# Patient Record
Sex: Female | Born: 1975 | Race: White | Hispanic: No | Smoking: Never smoker
Health system: Southern US, Community
[De-identification: ages and names within clinical notes are randomized; demographics above are authoritative.]

## PROBLEM LIST (undated history)

## (undated) DIAGNOSIS — F32A Depression, unspecified: Secondary | ICD-10-CM

## (undated) DIAGNOSIS — K219 Gastro-esophageal reflux disease without esophagitis: Secondary | ICD-10-CM

## (undated) DIAGNOSIS — F419 Anxiety disorder, unspecified: Secondary | ICD-10-CM

## (undated) DIAGNOSIS — F329 Major depressive disorder, single episode, unspecified: Secondary | ICD-10-CM

## (undated) DIAGNOSIS — T7840XA Allergy, unspecified, initial encounter: Secondary | ICD-10-CM

## (undated) HISTORY — DX: Gastro-esophageal reflux disease without esophagitis: K21.9

## (undated) HISTORY — DX: Depression, unspecified: F32.A

## (undated) HISTORY — DX: Allergy, unspecified, initial encounter: T78.40XA

## (undated) HISTORY — DX: Major depressive disorder, single episode, unspecified: F32.9

## (undated) HISTORY — DX: Anxiety disorder, unspecified: F41.9

---

## 1995-01-29 DIAGNOSIS — T7840XA Allergy, unspecified, initial encounter: Secondary | ICD-10-CM

## 1995-01-29 HISTORY — DX: Allergy, unspecified, initial encounter: T78.40XA

## 2012-12-08 DIAGNOSIS — D239 Other benign neoplasm of skin, unspecified: Secondary | ICD-10-CM

## 2012-12-08 HISTORY — DX: Other benign neoplasm of skin, unspecified: D23.9

## 2014-05-30 ENCOUNTER — Other Ambulatory Visit: Payer: Self-pay | Admitting: Family Medicine

## 2014-05-30 DIAGNOSIS — Z1239 Encounter for other screening for malignant neoplasm of breast: Secondary | ICD-10-CM

## 2014-06-30 ENCOUNTER — Ambulatory Visit: Payer: Self-pay

## 2014-07-05 ENCOUNTER — Ambulatory Visit
Admission: RE | Admit: 2014-07-05 | Discharge: 2014-07-05 | Disposition: A | Discharge status: Home / Self Care | Payer: BLUE CROSS/BLUE SHIELD | Source: Ambulatory Visit | Attending: Family Medicine | Admitting: Family Medicine

## 2014-07-05 DIAGNOSIS — Z1231 Encounter for screening mammogram for malignant neoplasm of breast: Secondary | ICD-10-CM | POA: Diagnosis not present

## 2014-07-05 DIAGNOSIS — Z1239 Encounter for other screening for malignant neoplasm of breast: Secondary | ICD-10-CM

## 2015-03-02 ENCOUNTER — Other Ambulatory Visit: Payer: Self-pay | Admitting: Family Medicine

## 2015-03-02 NOTE — Telephone Encounter (Signed)
Patient requesting refill. 

## 2016-01-30 ENCOUNTER — Other Ambulatory Visit: Payer: Self-pay | Admitting: Family Medicine

## 2016-01-30 NOTE — Telephone Encounter (Signed)
Patient requesting refill of Sertraline to [facility].

## 2017-03-31 ENCOUNTER — Encounter: Payer: Self-pay | Admitting: Family Medicine

## 2017-04-04 ENCOUNTER — Ambulatory Visit
Admission: RE | Admit: 2017-04-04 | Discharge: 2017-04-04 | Disposition: A | Discharge status: Home / Self Care | Payer: Managed Care, Other (non HMO) | Source: Ambulatory Visit | Attending: Family Medicine | Admitting: Family Medicine

## 2017-04-04 ENCOUNTER — Encounter: Payer: Self-pay | Admitting: Family Medicine

## 2017-04-04 ENCOUNTER — Ambulatory Visit (INDEPENDENT_AMBULATORY_CARE_PROVIDER_SITE_OTHER): Payer: Managed Care, Other (non HMO) | Admitting: Family Medicine

## 2017-04-04 VITALS — BP 128/76 | HR 89 | Temp 98.4°F | Resp 18 | Ht 63.0 in | Wt 133.2 lb

## 2017-04-04 DIAGNOSIS — F419 Anxiety disorder, unspecified: Secondary | ICD-10-CM | POA: Diagnosis not present

## 2017-04-04 DIAGNOSIS — K219 Gastro-esophageal reflux disease without esophagitis: Secondary | ICD-10-CM | POA: Diagnosis not present

## 2017-04-04 DIAGNOSIS — R05 Cough: Secondary | ICD-10-CM | POA: Insufficient documentation

## 2017-04-04 DIAGNOSIS — R0989 Other specified symptoms and signs involving the circulatory and respiratory systems: Secondary | ICD-10-CM

## 2017-04-04 DIAGNOSIS — R058 Other specified cough: Secondary | ICD-10-CM

## 2017-04-04 DIAGNOSIS — F339 Major depressive disorder, recurrent, unspecified: Secondary | ICD-10-CM | POA: Insufficient documentation

## 2017-04-04 DIAGNOSIS — J302 Other seasonal allergic rhinitis: Secondary | ICD-10-CM | POA: Diagnosis not present

## 2017-04-04 MED ORDER — AZITHROMYCIN 250 MG PO TABS: ORAL_TABLET | ORAL | 0 refills | Status: DC

## 2017-04-04 MED ORDER — BUPROPION HCL ER (XL) 150 MG PO TB24
150.0000 mg | ORAL_TABLET | Freq: Every day | ORAL | 0 refills | Status: DC
Start: 2017-04-04 — End: 2017-04-21

## 2017-04-04 MED ORDER — FLUTICASONE FUROATE-VILANTEROL 100-25 MCG/INH IN AEPB: 1.0000 | INHALATION_SPRAY | Freq: Every day | RESPIRATORY_TRACT | 0 refills | Status: DC

## 2017-04-04 NOTE — Progress Notes (Signed)
Name: Gwendolyn Hull   MRN: [id number]    DOB: 10/03/1975   Date:04/04/2017       Progress Note  Subjective  Chief Complaint  Chief Complaint  Patient presents with  . Establish Care  . Depression    HPI  Major Depression: she has a long history of depression. However she has been very down over the past 2 weeks. She moved to [place] a couple of years ago with husband and 3 sons. He had a job transfer. The plan was to move back. Her entire family lives here , they left a house behind and their marriage has not been great for a few years. In January they decided as a family to have her move back with younger son and had house remodeled before her husband can get a transfer back. Since she came back in January she has been feeling overwhelmed. She is teaching at [employer], feels like her marriage has been more fragile. Worried about her future and what she really wants to do. She is crying , feeling tired, lack of motivation. She already has an appointment with therapist for next week.   URI she states she had flu like symptoms 2 weeks ago, she had fever, chills, body aches. She is feeling much better, but continues to have a productive cough, no wheezing or SOB.   GERD: diet controlled   Patient Active Problem List   Diagnosis Date Noted  . GERD (gastroesophageal reflux disease) 04/04/2017  . Major depression, recurrent, chronic ([place]) 04/04/2017  . Anxiety 04/04/2017  . Seasonal allergic rhinitis 04/04/2017  . Allergy 01/29/1995    Past Surgical History:  Procedure Laterality Date  . CESAREAN SECTION  01/1998, 10/1999, 04/2002    Family History  Problem Relation Age of Onset  . Breast cancer Mother 69  . Cancer Mother        breast cancer 31 (age 81), tongue cancer 2011  . Diabetes Mother   . Colon cancer Maternal Grandmother   . Depression Brother   . Diabetes Father   . Hearing loss Father     Social History   Socioeconomic History  . Marital status: Married    Spouse  name: Not on file  . Number of children: 3  . Years of education: Not on file  . Highest education level: Bachelor's degree (e.g., BA, AB, BS)  Social Needs  . Financial resource strain: Not on file  . Food insecurity - worry: Not on file  . Food insecurity - inability: Not on file  . Transportation needs - medical: Not on file  . Transportation needs - non-medical: Not on file  Occupational History  . Occupation: Pharmacist, hospital   Tobacco Use  . Smoking status: Never Smoker  . Smokeless tobacco: Never Used  Substance and Sexual Activity  . Alcohol use: Yes    Comment: occasional drinks, no more than 3 in an evening  . Drug use: No  . Sexual activity: Not Currently    Birth control/protection: Condom  Other Topics Concern  . Not on file  Social History Narrative   Married for the past 71 years   Three sons, youngest is in [place]   They moved to [place] for husband's job about 2 years ago, but she came back to [place] with youngest son to work on their house but is feeling lost and not sure about her desicion     Current Outpatient Medications:  .  sertraline (ZOLOFT) 100 MG tablet, TAKE 1 TABLET BY  MOUTH ONCE DAILY AND TAKE 1 1/2 TABLETS BY MOUTH DAILY THE WEEK BEFORE YOUR CYCLE, Disp: 110 tablet, Rfl: 2  No Known Allergies   ROS  Constitutional: Negative for fever or weight change.  Respiratory: positive  for cough but  shortness of breath.   Cardiovascular: Negative for chest pain or palpitations.  Gastrointestinal: Negative for abdominal pain, no bowel changes.  Musculoskeletal: Negative for gait problem or joint swelling.  Skin: Negative for rash.  Neurological: Negative for dizziness or headache.  No other specific complaints in a complete review of systems (except as listed in HPI above).  Objective  Vitals:   04/04/17 1208  BP: 128/76  Pulse: 89  Resp: 18  Temp: 98.4 F (36.9 C)  TempSrc: Oral  SpO2: 98%  Weight: 133 lb 3.2 oz (60.4 kg)  Height: 5\' 3"  (1.6 m)     Body mass index is 23.6 kg/m.  Physical Exam  Constitutional: Patient appears well-developed and well-nourished.  No distress.  HEENT: head atraumatic, normocephalic, pupils equal and reactive to light,  neck supple, throat within normal limits Cardiovascular: Normal rate, regular rhythm and normal heart sounds.  No murmur heard. No BLE edema. Pulmonary/Chest: Effort normal she has end inspiratory wheezing and some course sounds left lower lung field. No respiratory distress. Abdominal: Soft.  There is no tenderness. Psychiatric: Patient has a depressed mood, . behavior is normal. Judgment and thought content normal.  PHQ2/9: Depression screen PHQ 2/9 04/04/2017  Decreased Interest 1  Down, Depressed, Hopeless 1  PHQ - 2 Score 2  Altered sleeping 1  Tired, decreased energy 3  Change in appetite 2  Feeling bad or failure about yourself  3  Trouble concentrating 1  Moving slowly or fidgety/restless 0  Suicidal thoughts 0  PHQ-9 Score 12  Difficult doing work/chores Somewhat difficult    Fall Risk: Fall Risk  04/04/2017 04/04/2017  Falls in the past year? No No     Functional Status Survey: Is the patient deaf or have difficulty hearing?: No Does the patient have difficulty seeing, even when wearing glasses/contacts?: No Does the patient have difficulty concentrating, remembering, or making decisions?: No Does the patient have difficulty walking or climbing stairs?: No Does the patient have difficulty dressing or bathing?: No Does the patient have difficulty doing errands alone such as visiting a doctor's office or shopping?: No    Assessment & Plan  1. Major depression, recurrent, chronic (HCC)  Continue Zoloft  Add Wellbutrin Continue  - buPROPion (WELLBUTRIN XL) 150 MG 24 hr tablet; Take 1 tablet (150 mg total) by mouth daily.  Dispense: 30 tablet; Refill: 0  2. Gastroesophageal reflux disease without esophagitis  Well controlled at this time  3.  Anxiety   4. Seasonal allergic rhinitis, unspecified trigger  stable  5. Productive cough  - DG Chest 2 View; Future - azithromycin (ZITHROMAX) 250 MG tablet; Take as directed  Dispense: 6 tablet; Refill: 0 - fluticasone furoate-vilanterol (BREO ELLIPTA) 100-25 MCG/INH AEPB; Inhale 1 puff into the lungs daily.  Dispense: 60 each; Refill: 0  6. Abnormal lung sounds  - DG Chest 2 View; Future - azithromycin (ZITHROMAX) 250 MG tablet; Take as directed  Dispense: 6 tablet; Refill: 0 - fluticasone furoate-vilanterol (BREO ELLIPTA) 100-25 MCG/INH AEPB; Inhale 1 puff into the lungs daily.  Dispense: 60 each; Refill: 0

## 2017-04-21 ENCOUNTER — Ambulatory Visit (INDEPENDENT_AMBULATORY_CARE_PROVIDER_SITE_OTHER): Payer: Managed Care, Other (non HMO) | Admitting: Family Medicine

## 2017-04-21 ENCOUNTER — Encounter: Payer: Self-pay | Admitting: Family Medicine

## 2017-04-21 VITALS — BP 120/70 | HR 99 | Resp 14 | Ht 63.0 in | Wt 130.4 lb

## 2017-04-21 DIAGNOSIS — F339 Major depressive disorder, recurrent, unspecified: Secondary | ICD-10-CM | POA: Diagnosis not present

## 2017-04-21 DIAGNOSIS — Z23 Encounter for immunization: Secondary | ICD-10-CM

## 2017-04-21 MED ORDER — BUPROPION HCL ER (XL) 150 MG PO TB24: 150.0000 mg | ORAL_TABLET | Freq: Every day | ORAL | 0 refills | Status: DC

## 2017-04-21 MED ORDER — SERTRALINE HCL 100 MG PO TABS: ORAL_TABLET | ORAL | 1 refills | Status: DC

## 2017-04-21 NOTE — Progress Notes (Signed)
Name: Gwendolyn Hull   MRN: [id number]    DOB: January 11, 1976   Date:04/21/2017       Progress Note  Subjective  Chief Complaint  Chief Complaint  Patient presents with  . Depression    needs a refill on her Zoloft    HPI   Major Depression: she has a long history of depression. She came in early March 2019 with worsening of symptoms. She moved to [place] a couple of years ago with husband and 3 sons. He had a job transfer. The plan was to move back. Her entire family lives here , they left a house behind and their marriage has not been great for a few years. In January they decided as a family to have her move back with younger son and had house remodeled before her husband can get a transfer back. Since she came back in January she has been feeling overwhelmed. 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  Since last visit she is doing much better, she is no on Zoloft 100 mg and we added Wellbutrin 150 mg daily. Also seen by Peggye Ley counselor, and is feeling much better, feels like she is back to her baseline.   Patient Active Problem List   Diagnosis Date Noted  . GERD (gastroesophageal reflux disease) 04/04/2017  . Major depression, recurrent, chronic ([place]) 04/04/2017  . Anxiety 04/04/2017  . Seasonal allergic rhinitis 04/04/2017  . Allergy 01/29/1995    Past Surgical History:  Procedure Laterality Date  . CESAREAN SECTION  01/1998, 10/1999, 04/2002    Family History  Problem Relation Age of Onset  . Breast cancer Mother 74  . Cancer Mother        breast cancer 47 (age 37), tongue cancer 2011  . Diabetes Mother   . Colon cancer Maternal Grandmother   . Depression Brother   . Diabetes Father   . Hearing loss Father     Social History   Socioeconomic History  . Marital status: Married    Spouse name: Not on file  . Number of children: 3  . Years of education: Not on file  . Highest education level: Bachelor's degree (e.g., BA, AB, BS)   Occupational History  . Occupation: Pharmacist, hospital   Social Needs  . Financial resource strain: Not on file  . Food insecurity:    Worry: Not on file    Inability: Not on file  . Transportation needs:    Medical: Not on file    Non-medical: Not on file  Tobacco Use  . Smoking status: Never Smoker  . Smokeless tobacco: Never Used  Substance and Sexual Activity  . Alcohol use: Yes    Comment: occasional drinks, no more than 3 in an evening  . Drug use: No  . Sexual activity: Not Currently    Birth control/protection: Condom  Lifestyle  . Physical activity:    Days per week: 0 days    Minutes per session: 0 min  . Stress: Not at all  Relationships  . Social connections:    Talks on phone: More than three times a week    Gets together: Twice a week    Attends religious service: Never    Active member of club or organization: Yes    Attends meetings of clubs or organizations: More than 4 times per year    Relationship status: Married  . Intimate partner violence:    Fear of current or ex partner: No  Emotionally abused: No    Physically abused: No    Forced sexual activity: No  Other Topics Concern  . Not on file  Social History Narrative   Married for the past 47 years   Three sons, youngest is in [place]   They moved to [place] for husband's job about 2 years ago, but she came back to [place] with youngest son to work on their house but is feeling lost and not sure about her desicion     Current Outpatient Medications:  .  buPROPion (WELLBUTRIN XL) 150 MG 24 hr tablet, Take 1 tablet (150 mg total) by mouth daily., Disp: 30 tablet, Rfl: 0 .  fluticasone furoate-vilanterol (BREO ELLIPTA) 100-25 MCG/INH AEPB, Inhale 1 puff into the lungs daily., Disp: 60 each, Rfl: 0 .  sertraline (ZOLOFT) 100 MG tablet, TAKE 1 TABLET BY MOUTH ONCE DAILY AND TAKE 1 1/2 TABLETS BY MOUTH DAILY THE WEEK BEFORE YOUR CYCLE, Disp: 110 tablet, Rfl: 2 .  azithromycin (ZITHROMAX) 250 MG tablet, Take as  directed (Patient not taking: Reported on 04/21/2017), Disp: 6 tablet, Rfl: 0  No Known Allergies   ROS  Constitutional: Negative for fever or significant weight change.  Respiratory: Negative for cough and shortness of breath.   Cardiovascular: Negative for chest pain or palpitations.  Gastrointestinal: Negative for abdominal pain, no bowel changes.  Musculoskeletal: Negative for gait problem or joint swelling.  Skin: Negative for rash.  Neurological: Negative for dizziness or headache.  No other specific complaints in a complete review of systems (except as listed in HPI above).  Objective  Vitals:   04/21/17 1355  BP: 120/70  Pulse: 99  Resp: 14  SpO2: 98%  Weight: 130 lb 6.4 oz (59.1 kg)  Height: 5\' 3"  (1.6 m)    Body mass index is 23.1 kg/m.  Physical Exam  Constitutional: Patient appears well-developed and well-nourished.  No distress.  HEENT: head atraumatic, normocephalic, pupils equal and reactive to light,  neck supple, throat within normal limits Cardiovascular: Normal rate, regular rhythm and normal heart sounds.  No murmur heard. No BLE edema. Pulmonary/Chest: Effort normal and breath sounds normal. No respiratory distress. Abdominal: Soft.  There is no tenderness. Psychiatric: Patient has a normal mood and affect. behavior is normal. Judgment and thought content normal.  PHQ2/9: Depression screen [facility] 2/9 04/21/2017 04/04/2017  Decreased Interest 0 1  Down, Depressed, Hopeless 0 1  PHQ - 2 Score 0 2  Altered sleeping 0 1  Tired, decreased energy 0 3  Change in appetite 0 2  Feeling bad or failure about yourself  0 3  Trouble concentrating 0 1  Moving slowly or fidgety/restless 0 0  Suicidal thoughts 0 0  PHQ-9 Score 0 12  Difficult doing work/chores Not difficult at all Somewhat difficult     Fall Risk: Fall Risk  04/21/2017 04/04/2017 04/04/2017  Falls in the past year? No No No     Functional Status Survey: Is the patient deaf or have difficulty  hearing?: No Does the patient have difficulty seeing, even when wearing glasses/contacts?: No Does the patient have difficulty concentrating, remembering, or making decisions?: No Does the patient have difficulty walking or climbing stairs?: No Does the patient have difficulty dressing or bathing?: No Does the patient have difficulty doing errands alone such as visiting a doctor's office or shopping?: No    Assessment & Plan  1. Major depression, recurrent, chronic (HCC)  - buPROPion (WELLBUTRIN XL) 150 MG 24 hr tablet; Take 1 tablet (  150 mg total) by mouth daily.  Dispense: 90 tablet; Refill: 0 - sertraline (ZOLOFT) 100 MG tablet; Daily  Dispense: 90 tablet; Refill: 1 Seeing Peggye Ley for therapy.   2. 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

## 2017-06-24 ENCOUNTER — Other Ambulatory Visit: Payer: Self-pay | Admitting: Family Medicine

## 2017-06-24 DIAGNOSIS — F339 Major depressive disorder, recurrent, unspecified: Secondary | ICD-10-CM

## 2017-07-22 ENCOUNTER — Ambulatory Visit: Payer: Managed Care, Other (non HMO) | Admitting: Family Medicine

## 2017-08-29 ENCOUNTER — Encounter: Payer: Self-pay | Admitting: Family Medicine

## 2017-09-03 ENCOUNTER — Ambulatory Visit: Payer: Managed Care, Other (non HMO) | Admitting: Family Medicine

## 2017-09-09 ENCOUNTER — Encounter: Payer: Self-pay | Admitting: Family Medicine

## 2017-09-09 ENCOUNTER — Ambulatory Visit (INDEPENDENT_AMBULATORY_CARE_PROVIDER_SITE_OTHER): Payer: Managed Care, Other (non HMO) | Admitting: Family Medicine

## 2017-09-09 VITALS — BP 122/80 | HR 89 | Temp 98.6°F | Resp 14 | Ht 63.0 in | Wt 133.8 lb

## 2017-09-09 DIAGNOSIS — K219 Gastro-esophageal reflux disease without esophagitis: Secondary | ICD-10-CM

## 2017-09-09 DIAGNOSIS — F339 Major depressive disorder, recurrent, unspecified: Secondary | ICD-10-CM

## 2017-09-09 DIAGNOSIS — F419 Anxiety disorder, unspecified: Secondary | ICD-10-CM

## 2017-09-09 MED ORDER — BUPROPION HCL ER (XL) 150 MG PO TB24: 150.0000 mg | ORAL_TABLET | Freq: Every day | ORAL | 1 refills | Status: DC

## 2017-09-09 MED ORDER — SERTRALINE HCL 100 MG PO TABS: ORAL_TABLET | ORAL | 1 refills | Status: DC

## 2017-09-09 NOTE — Progress Notes (Signed)
Name: Gwendolyn Hull   MRN: [id number]    DOB: [birth year]   Date:09/09/2017       Progress Note  Subjective  Chief Complaint  Chief Complaint  Patient presents with  . Depression    HPI  Major Depression: she has a long history of depression. She came in early March 2019 with worsening of symptoms. She moved to [place] a couple of years ago with husband and 3 sons. He had a job transfer. The plan was to move back. Her entire family moved there by they left a house behind. January 2019 they decided as a family to have her move back with younger son to remodeled the house , since the plan was always to come back.  Since felt overwhelmed as a single parent, away from her husband, strained her marriage .  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  She is still taking zoloft and wellbutrin, no side of medication. Doing well.   GERD: doing well, no symptoms.   Patient Active Problem List   Diagnosis Date Noted  . GERD (gastroesophageal reflux disease) 04/04/2017  . Major depression, recurrent, chronic ([place]) 04/04/2017  . Anxiety 04/04/2017  . Seasonal allergic rhinitis 04/04/2017  . Allergy 01/29/1995    Past Surgical History:  Procedure Laterality Date  . CESAREAN SECTION  01/1998, 10/1999, 04/2002    Family History  Problem Relation Age of Onset  . Breast cancer Mother 50  . Cancer Mother        breast cancer 1 (age 43), tongue cancer 2011  . Diabetes Mother   . Colon cancer Maternal Grandmother   . Depression Brother   . Diabetes Father   . Hearing loss Father     Social History   Socioeconomic History  . Marital status: Married    Spouse name: Rodman Key  . Number of children: 3  . Years of education: Not on file  . Highest education level: Bachelor's degree (e.g., BA, AB, BS)  Occupational History  . Occupation: Pharmacist, hospital   Social  Needs  . Financial resource strain: Not hard at all  . Food insecurity:    Worry: Never true    Inability: Never true  . Transportation needs:    Medical: No    Non-medical: No  Tobacco Use  . Smoking status: Never Smoker  . Smokeless tobacco: Never Used  Substance and Sexual Activity  . Alcohol use: Yes    Comment: occasional drinks, no more than 3 in an evening  . Drug use: No  . Sexual activity: Not Currently    Birth control/protection: Condom  Lifestyle  . Physical activity:    Days per week: 0 days    Minutes per session: 0 min  . Stress: To some extent  Relationships  . Social connections:    Talks on phone: More than three times a week    Gets together: Twice a week    Attends religious service: Never    Active member of club or organization: Yes    Attends meetings of clubs or organizations: More than 4 times per year    Relationship status: Married  . Intimate partner violence:    Fear of current or ex partner: No    Emotionally abused: No    Physically abused: No    Forced sexual activity: No  Other  Topics Concern  . Not on file  Social History Narrative   Married for the past 60 years   Three sons, youngest is in [place]   They moved to [place] for husband's job about 2 years ago, but she came back to [place] with youngest son to work on their house but is feeling lost and not sure about her desicion     Current Outpatient Medications:  .  buPROPion (WELLBUTRIN XL) 150 MG 24 hr tablet, TAKE 1 TABLET BY MOUTH EVERY DAY, Disp: 90 tablet, Rfl: 0 .  sertraline (ZOLOFT) 100 MG tablet, Daily, Disp: 90 tablet, Rfl: 1  No Known Allergies   ROS  Constitutional: Negative for fever or weight change.  Respiratory: Negative for cough and shortness of breath.   Cardiovascular: Negative for chest pain or palpitations.  Gastrointestinal: Negative for abdominal pain, no bowel changes.  Musculoskeletal: Negative for gait problem or joint swelling.  Skin: Negative for rash.   Neurological: Negative for dizziness or headache.  No other specific complaints in a complete review of systems (except as listed in HPI above).  Objective  Vitals:   09/09/17 1441  BP: 122/80  Pulse: 89  Resp: 14  Temp: 98.6 F (37 C)  TempSrc: Oral  SpO2: 95%  Weight: 133 lb 12.8 oz (60.7 kg)  Height: 5\' 3"  (1.6 m)    Body mass index is 23.7 kg/m.  Physical Exam  Constitutional: Patient appears well-developed and well-nourished. No distress.  HEENT: head atraumatic, normocephalic, pupils equal and reactive to light,neck supple, throat within normal limits Cardiovascular: Normal rate, regular rhythm and normal heart sounds.  No murmur heard. No BLE edema. Pulmonary/Chest: Effort normal and breath sounds normal. No respiratory distress. Abdominal: Soft.  There is no tenderness. Psychiatric: Patient has a normal mood and affect. behavior is normal. Judgment and thought content normal.  PHQ2/9: Depression screen [facility] 2/9 09/09/2017 04/21/2017 04/04/2017  Decreased Interest 0 0 1  Down, Depressed, Hopeless 0 0 1  PHQ - 2 Score 0 0 2  Altered sleeping 0 0 1  Tired, decreased energy 1 0 3  Change in appetite 0 0 2  Feeling bad or failure about yourself  1 0 3  Trouble concentrating 0 0 1  Moving slowly or fidgety/restless 0 0 0  Suicidal thoughts 0 0 0  PHQ-9 Score 2 0 12  Difficult doing work/chores - Not difficult at all Somewhat difficult    Fall Risk: Fall Risk  04/21/2017 04/04/2017 04/04/2017  Falls in the past year? No No No    Functional Status Survey: Is the patient deaf or have difficulty hearing?: No Does the patient have difficulty seeing, even when wearing glasses/contacts?: No Does the patient have difficulty concentrating, remembering, or making decisions?: No Does the patient have difficulty walking or climbing stairs?: No Does the patient have difficulty dressing or bathing?: No Does the patient have difficulty doing errands alone such as visiting a  doctor's office or shopping?: No    Assessment & Plan  1. Major depression, recurrent, chronic (HCC)  - buPROPion (WELLBUTRIN XL) 150 MG 24 hr tablet; Take 1 tablet (150 mg total) by mouth daily.  Dispense: 90 tablet; Refill: 1 - sertraline (ZOLOFT) 100 MG tablet; Daily  Dispense: 90 tablet; Refill: 1  2. Gastroesophageal reflux disease without esophagitis  Doing well at this time  3. Anxiety  - buPROPion (WELLBUTRIN XL) 150 MG 24 hr tablet; Take 1 tablet (150 mg total) by mouth daily.  Dispense: 90 tablet; Refill:  1 - sertraline (ZOLOFT) 100 MG tablet; Daily  Dispense: 90 tablet; Refill: 1

## 2017-10-07 ENCOUNTER — Other Ambulatory Visit: Payer: Self-pay | Admitting: Family Medicine

## 2017-10-07 ENCOUNTER — Other Ambulatory Visit: Payer: Self-pay

## 2017-10-07 DIAGNOSIS — Z01419 Encounter for gynecological examination (general) (routine) without abnormal findings: Secondary | ICD-10-CM

## 2017-10-07 DIAGNOSIS — Z1231 Encounter for screening mammogram for malignant neoplasm of breast: Secondary | ICD-10-CM

## 2017-10-08 LAB — LIPID PANEL
CHOL/HDL RATIO: 3.7 (calc) (ref <5.0)
Cholesterol: 232 mg/dL — ABNORMAL HIGH (ref <200)
HDL: 63 mg/dL (ref >50)
LDL CHOLESTEROL (CALC): 146 mg/dL — AB
Non-HDL Cholesterol (Calc): 169 mg/dL (calc) — ABNORMAL HIGH (ref <130)
Triglycerides: 115 mg/dL (ref <150)

## 2017-10-08 LAB — CBC WITH DIFFERENTIAL/PLATELET
Basophils Absolute: 37 cells/uL (ref 0–200)
Basophils Relative: 0.6 %
EOS ABS: 19 {cells}/uL (ref 15–500)
Eosinophils Relative: 0.3 %
HEMATOCRIT: 39.5 % (ref 35.0–45.0)
HEMOGLOBIN: 13.5 g/dL (ref 11.7–15.5)
LYMPHS ABS: 1668 {cells}/uL (ref 850–3900)
MCH: 31.3 pg (ref 27.0–33.0)
MCHC: 34.2 g/dL (ref 32.0–36.0)
MCV: 91.6 fL (ref 80.0–100.0)
MPV: 10.1 fL (ref 7.5–12.5)
Monocytes Relative: 7.6 %
NEUTROS ABS: 4005 {cells}/uL (ref 1500–7800)
Neutrophils Relative %: 64.6 %
Platelets: 252 10*3/uL (ref 140–400)
RBC: 4.31 10*6/uL (ref 3.80–5.10)
RDW: 11.5 % (ref 11.0–15.0)
Total Lymphocyte: 26.9 %
WBC: 6.2 10*3/uL (ref 3.8–10.8)
WBCMIX: 471 {cells}/uL (ref 200–950)

## 2017-10-08 LAB — COMPREHENSIVE METABOLIC PANEL
AG RATIO: 2 (calc) (ref 1.0–2.5)
ALT: 10 U/L (ref 6–29)
AST: 17 U/L (ref 10–30)
Albumin: 4.2 g/dL (ref 3.6–5.1)
Alkaline phosphatase (APISO): 73 U/L (ref 33–115)
BUN: 11 mg/dL (ref 7–25)
CO2: 26 mmol/L (ref 20–32)
CREATININE: 0.7 mg/dL (ref 0.50–1.10)
Calcium: 8.9 mg/dL (ref 8.6–10.2)
Chloride: 103 mmol/L (ref 98–110)
GLUCOSE: 87 mg/dL (ref 65–99)
Globulin: 2.1 g/dL (calc) (ref 1.9–3.7)
Potassium: 4.2 mmol/L (ref 3.5–5.3)
Sodium: 137 mmol/L (ref 135–146)
TOTAL PROTEIN: 6.3 g/dL (ref 6.1–8.1)
Total Bilirubin: 0.7 mg/dL (ref 0.2–1.2)

## 2017-10-08 LAB — HEMOGLOBIN A1C
HEMOGLOBIN A1C: 5.5 %{Hb} (ref <5.7)
Mean Plasma Glucose: 111 (calc)
eAG (mmol/L): 6.2 (calc)

## 2017-10-08 LAB — TSH: TSH: 1.34 mIU/L

## 2017-10-09 ENCOUNTER — Encounter: Payer: Self-pay | Admitting: Family Medicine

## 2017-10-09 ENCOUNTER — Other Ambulatory Visit (HOSPITAL_COMMUNITY)
Admission: RE | Admit: 2017-10-09 | Discharge: 2017-10-09 | Disposition: A | Discharge status: Home / Self Care | Payer: Managed Care, Other (non HMO) | Source: Ambulatory Visit | Attending: Family Medicine | Admitting: Family Medicine

## 2017-10-09 ENCOUNTER — Ambulatory Visit (INDEPENDENT_AMBULATORY_CARE_PROVIDER_SITE_OTHER): Payer: Managed Care, Other (non HMO) | Admitting: Family Medicine

## 2017-10-09 VITALS — BP 108/74 | Temp 98.3°F | Resp 16 | Ht 62.5 in | Wt 133.9 lb

## 2017-10-09 DIAGNOSIS — Z23 Encounter for immunization: Secondary | ICD-10-CM | POA: Diagnosis not present

## 2017-10-09 DIAGNOSIS — Z01419 Encounter for gynecological examination (general) (routine) without abnormal findings: Secondary | ICD-10-CM | POA: Diagnosis not present

## 2017-10-09 DIAGNOSIS — Z124 Encounter for screening for malignant neoplasm of cervix: Secondary | ICD-10-CM | POA: Insufficient documentation

## 2017-10-09 NOTE — Progress Notes (Signed)
Name: Gwendolyn Hull   MRN: [id number]    DOB: Dec 22, 1975   Date:10/09/2017       Progress Note  Subjective  Chief Complaint  Chief Complaint  Patient presents with  . Annual Exam    HPI   Patient presents for annual CPE   USPSTF grade A and B recommendations    Office Visit from 09/09/2017 in [facility]  AUDIT-C Score  2     Depression:  Depression screen [facility] 2/9 10/09/2017 09/09/2017 04/21/2017 04/04/2017  Decreased Interest 0 0 0 1  Down, Depressed, Hopeless 0 0 0 1  PHQ - 2 Score 0 0 0 2  Altered sleeping 0 0 0 1  Tired, decreased energy 0 1 0 3  Change in appetite 0 0 0 2  Feeling bad or failure about yourself  0 1 0 3  Trouble concentrating 1 0 0 1  Moving slowly or fidgety/restless 0 0 0 0  Suicidal thoughts - 0 0 0  PHQ-9 Score 1 2 0 12  Difficult doing work/chores Not difficult at all - Not difficult at all Somewhat difficult   Hypertension: BP Readings from Last 3 Encounters:  10/09/17 108/74  09/09/17 122/80  04/21/17 120/70   Obesity: Wt Readings from Last 3 Encounters:  10/09/17 133 lb 14.4 oz (60.7 kg)  09/09/17 133 lb 12.8 oz (60.7 kg)  04/21/17 130 lb 6.4 oz (59.1 kg)   BMI Readings from Last 3 Encounters:  10/09/17 24.10 kg/m  09/09/17 23.70 kg/m  04/21/17 23.10 kg/m     STD testing and prevention (HIV/chl/gon/syphilis): N/A Intimate partner violence: negative  Sexual History/Pain during Intercourse: no pain during intercourse Menstrual History/LMP/Abnormal Bleeding: regular cycles Incontinence Symptoms: occasionally has stress incontinence - mild - discussed kegel   Advanced Care Planning: A voluntary discussion about advance care planning including the explanation and discussion of advance directives.  Discussed health care proxy and Living will, and the patient was able to identify a health care proxy as husband   Patient does have a living will.  Breast cancer: mammogram scheduled  BRCA gene screening: mother had  breast cancer, N/A Cervical cancer screening: today    Lipids:  Lab Results  Component Value Date   CHOL 232 (H) 10/07/2017   Lab Results  Component Value Date   HDL 63 10/07/2017   Lab Results  Component Value Date   LDLCALC 146 (H) 10/07/2017   Lab Results  Component Value Date   TRIG 115 10/07/2017   Lab Results  Component Value Date   CHOLHDL 3.7 10/07/2017   No results found for: LDLDIRECT  Glucose:  Glucose, Bld  Date Value Ref Range Status  10/07/2017 87 65 - 99 mg/dL Final    Comment:    .            Fasting reference interval .     Skin cancer: seeing dermatologist   Patient Active Problem List   Diagnosis Date Noted  . GERD (gastroesophageal reflux disease) 04/04/2017  . Major depression, recurrent, chronic ([place]) 04/04/2017  . Anxiety 04/04/2017  . Seasonal allergic rhinitis 04/04/2017  . Allergy 01/29/1995    Past Surgical History:  Procedure Laterality Date  . CESAREAN SECTION  01/1998, 10/1999, 04/2002    Family History  Problem Relation Age of Onset  . Breast cancer Mother 6  . Cancer Mother        breast cancer 89 (age 49), tongue cancer 2011  . Diabetes Mother   . Colon  cancer Maternal Grandmother   . Depression Brother   . Diabetes Father   . Hearing loss Father   . Heart disease Maternal Grandfather   . Cancer Paternal Grandfather   . Diabetes Paternal Grandfather     Social History   Socioeconomic History  . Marital status: Married    Spouse name: Rodman Key  . Number of children: 3  . Years of education: Not on file  . Highest education level: Bachelor's degree (e.g., BA, AB, BS)  Occupational History  . Occupation: Pharmacist, hospital   Social Needs  . Financial resource strain: Not hard at all  . Food insecurity:    Worry: Never true    Inability: Never true  . Transportation needs:    Medical: No    Non-medical: No  Tobacco Use  . Smoking status: Never Smoker  . Smokeless tobacco: Never Used  Substance and Sexual  Activity  . Alcohol use: Yes    Comment: occasional drinks, no more than 3 in an evening  . Drug use: No  . Sexual activity: Yes    Partners: Male    Birth control/protection: Condom  Lifestyle  . Physical activity:    Days per week: 4 days    Minutes per session: 50 min  . Stress: Not at all  Relationships  . Social connections:    Talks on phone: More than three times a week    Gets together: Twice a week    Attends religious service: Never    Active member of club or organization: Yes    Attends meetings of clubs or organizations: More than 4 times per year    Relationship status: Married  . Intimate partner violence:    Fear of current or ex partner: No    Emotionally abused: No    Physically abused: No    Forced sexual activity: No  Other Topics Concern  . 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  buPROPion (WELLBUTRIN XL) 150 MG 24 hr tablet, Take 1 tablet (150 mg total) by mouth daily., Disp: 90 tablet, Rfl: 1 .  sertraline (ZOLOFT) 100 MG tablet, Daily, Disp: 90 tablet, Rfl: 1  No Known Allergies   ROS  Constitutional: Negative for fever or weight change.  Respiratory: Negative for cough and shortness of breath.   Cardiovascular: Negative for chest pain or palpitations.  Gastrointestinal: Negative for abdominal pain, no bowel changes.  Musculoskeletal: Negative for gait problem or joint swelling.  Skin: Negative for rash.  Neurological: Negative for dizziness or headache.  No other specific complaints in a complete review of systems (except as listed in HPI above).  Objective  Vitals:   10/09/17 1022  BP: 108/74  Resp: 16  Temp: 98.3 F  (36.8 C)  TempSrc: Oral  SpO2: 98%  Weight: 133 lb 14.4 oz (60.7 kg)  Height: 5' 2.5" (1.588 m)    Body mass index is 24.1 kg/m.  Physical Exam  Constitutional: Patient appears well-developed and well-nourished. No distress.  HENT: Head: Normocephalic and atraumatic. Ears: B TMs ok, no erythema or effusion; Nose: Nose  normal. Mouth/Throat: Oropharynx is clear and moist. No oropharyngeal exudate.  Eyes: Conjunctivae and EOM are normal. Pupils are equal, round, and reactive to light. No scleral icterus.  Neck: Normal range of motion. Neck supple. No JVD present. No thyromegaly present.  Cardiovascular: Normal rate, regular rhythm and normal heart sounds.  No murmur heard. No BLE edema. Pulmonary/Chest: Effort normal and breath sounds normal. No respiratory distress. Abdominal: Soft. Bowel sounds are normal, no distension. There is no tenderness. no masses Breast: no lumps or masses, no nipple discharge or rashes FEMALE GENITALIA:  External genitalia normal External urethra normal Vaginal vault normal without discharge or lesions Cervix normal without discharge or lesions Bimanual exam normal without masses RECTAL: not done Musculoskeletal: Normal range of motion, no joint effusions. No gross deformities Neurological: he is alert and oriented to person, place, and time. No cranial nerve deficit. Coordination, balance, strength, speech and gait are normal.  Skin: Skin is warm and dry. No rash noted. No erythema.  Psychiatric: Patient has a normal mood and affect. behavior is normal. Judgment and thought content normal.   Recent Results (from the past 2160 hour(s))  Lipid panel     Status: Abnormal   Collection Time: 10/07/17  9:14 AM  Result Value Ref Range   Cholesterol 232 (H) <200 mg/dL   HDL 63 >50 mg/dL   Triglycerides 115 <150 mg/dL   LDL Cholesterol (Calc) 146 (H) mg/dL (calc)    Comment: Reference range: <100 . Desirable range <100 mg/dL for primary prevention;   <70  mg/dL for patients with CHD or diabetic patients  with > or = 2 CHD risk factors. Marland Kitchen LDL-C is now calculated using the Martin-Hopkins  calculation, which is a validated novel method providing  better accuracy than the Friedewald equation in the  estimation of LDL-C.  Cresenciano Genre et al. Annamaria Helling. 1610;960(45): 2061-2068  (http://education.[facility].com/faq/FAQ164)    Total CHOL/HDL Ratio 3.7 <5.0 (calc)   Non-HDL Cholesterol (Calc) 169 (H) <130 mg/dL (calc)    Comment: For patients with diabetes plus 1 major ASCVD risk  factor, treating to a non-HDL-C goal of <100 mg/dL  (LDL-C of <70 mg/dL) is considered a therapeutic  option.   Comprehensive metabolic panel     Status: None   Collection Time: 10/07/17  9:14 AM  Result Value Ref Range   Glucose, Bld 87 65 - 99 mg/dL    Comment: .            Fasting reference interval .    BUN 11 7 - 25 mg/dL   Creat 0.70 0.50 - 1.10 mg/dL   BUN/Creatinine Ratio NOT APPLICABLE 6 - 22 (calc)   Sodium 137 135 - 146 mmol/L   Potassium 4.2 3.5 - 5.3 mmol/L   Chloride 103 98 - 110 mmol/L   CO2 26 20 - 32 mmol/L   Calcium 8.9 8.6 - 10.2 mg/dL   Total Protein 6.3 6.1 - 8.1 g/dL   Albumin 4.2 3.6 - 5.1 g/dL   Globulin 2.1 1.9 - 3.7 g/dL (calc)   AG Ratio 2.0 1.0 - 2.5 (calc)   Total Bilirubin 0.7 0.2 - 1.2 mg/dL   Alkaline phosphatase (APISO) 73 33 - 115 U/L   AST 17 10 - 30 U/L   ALT 10 6 - 29 U/L  CBC with Differential/Platelet     Status: None   Collection Time: 10/07/17  9:14 AM  Result Value Ref Range   WBC 6.2 3.8 - 10.8 Thousand/uL   RBC 4.31 3.80 -  5.10 Million/uL   Hemoglobin 13.5 11.7 - 15.5 g/dL   HCT 39.5 35.0 - 45.0 %   MCV 91.6 80.0 - 100.0 fL   MCH 31.3 27.0 - 33.0 pg   MCHC 34.2 32.0 - 36.0 g/dL   RDW 11.5 11.0 - 15.0 %   Platelets 252 140 - 400 Thousand/uL   MPV 10.1 7.5 - 12.5 fL   Neutro Abs 4,005 1,500 - 7,800 cells/uL   Lymphs Abs 1,668 850 - 3,900 cells/uL   WBC mixed population 471 200 - 950 cells/uL    Eosinophils Absolute 19 15 - 500 cells/uL   Basophils Absolute 37 0 - 200 cells/uL   Neutrophils Relative % 64.6 %   Total Lymphocyte 26.9 %   Monocytes Relative 7.6 %   Eosinophils Relative 0.3 %   Basophils Relative 0.6 %  TSH     Status: None   Collection Time: 10/07/17  9:14 AM  Result Value Ref Range   TSH 1.34 mIU/L    Comment:           Reference Range .           > or = 20 Years  0.40-4.50 .                Pregnancy Ranges           First trimester    0.26-2.66           Second trimester   0.55-2.73           Third trimester    0.43-2.91   HgB A1c     Status: None   Collection Time: 10/07/17  9:14 AM  Result Value Ref Range   Hgb A1c MFr Bld 5.5 <5.7 % of total Hgb    Comment: For the purpose of screening for the presence of diabetes: . <5.7%       Consistent with the absence of diabetes 5.7-6.4%    Consistent with increased risk for diabetes             (prediabetes) > or =6.5%  Consistent with diabetes . This assay result is consistent with a decreased risk of diabetes. . Currently, no consensus exists regarding use of hemoglobin A1c for diagnosis of diabetes in children. . According to American Diabetes Association (ADA) guidelines, hemoglobin A1c <7.0% represents optimal control in non-pregnant diabetic patients. Different metrics may apply to specific patient populations.  Standards of Medical Care in Diabetes(ADA). .    Mean Plasma Glucose 111 (calc)   eAG (mmol/L) 6.2 (calc)     PHQ2/9: Depression screen [facility] 2/9 10/09/2017 09/09/2017 04/21/2017 04/04/2017  Decreased Interest 0 0 0 1  Down, Depressed, Hopeless 0 0 0 1  PHQ - 2 Score 0 0 0 2  Altered sleeping 0 0 0 1  Tired, decreased energy 0 1 0 3  Change in appetite 0 0 0 2  Feeling bad or failure about yourself  0 1 0 3  Trouble concentrating 1 0 0 1  Moving slowly or fidgety/restless 0 0 0 0  Suicidal thoughts - 0 0 0  PHQ-9 Score 1 2 0 12  Difficult doing work/chores Not difficult at all -  Not difficult at all Somewhat difficult    GAD 7 : Generalized Anxiety Score 10/09/2017 09/09/2017 04/21/2017 04/04/2017  Nervous, Anxious, on Edge 0 1 0 1  Control/stop worrying 0 0 0 2  Worry too much - different things 0 1 0 3  Trouble relaxing 0  0 1 0  Restless 0 0 0 0  Easily annoyed or irritable 0 0 0 0  Afraid - awful might happen 0 0 0 2  Total GAD 7 Score 0 '2 1 8  ' Anxiety Difficulty Not difficult at all - Not difficult at all -    Fall Risk: Fall Risk  04/21/2017 04/04/2017 04/04/2017  Falls in the past year? No No No    Functional Status Survey: Is the patient deaf or have difficulty hearing?: No Does the patient have difficulty seeing, even when wearing glasses/contacts?: No Does the patient have difficulty concentrating, remembering, or making decisions?: No Does the patient have difficulty walking or climbing stairs?: No Does the patient have difficulty dressing or bathing?: No Does the patient have difficulty doing errands alone such as visiting a doctor's office or shopping?: No  Assessment & Plan  1. Well woman exam   2. Flu vaccine need  - Flu Vaccine QUAD 6+ mos PF IM (Fluarix Quad PF)  3. Cervical cancer screening  - Cytology - PAP  -USPSTF grade A and B recommendations reviewed with patient; age-appropriate recommendations, preventive care, screening tests, etc discussed and encouraged; healthy living encouraged; see AVS for patient education given to patient -Discussed importance of 150 minutes of physical activity weekly, eat two servings of fish weekly, eat one serving of tree nuts ( cashews, pistachios, pecans, almonds.Marland Kitchen) every other day, eat 6 servings of fruit/vegetables daily and drink plenty of water and avoid sweet beverages.

## 2017-10-09 NOTE — Patient Instructions (Signed)
Preventive Care 40-64 Years, Female Preventive care refers to lifestyle choices and visits with your health care provider that can promote health and wellness. What does preventive care include?  A yearly physical exam. This is also called an annual well check.  Dental exams once or twice a year.  Routine eye exams. Ask your health care provider how often you should have your eyes checked.  Personal lifestyle choices, including: ? Daily care of your teeth and gums. ? Regular physical activity. ? Eating a healthy diet. ? Avoiding tobacco and drug use. ? Limiting alcohol use. ? Practicing safe sex. ? Taking low-dose aspirin daily starting at age 58. ? Taking vitamin and mineral supplements as recommended by your health care provider. What happens during an annual well check? The services and screenings done by your health care provider during your annual well check will depend on your age, overall health, lifestyle risk factors, and family history of disease. Counseling Your health care provider may ask you questions about your:  Alcohol use.  Tobacco use.  Drug use.  Emotional well-being.  Home and relationship well-being.  Sexual activity.  Eating habits.  Work and work Statistician.  Method of birth control.  Menstrual cycle.  Pregnancy history.  Screening You may have the following tests or measurements:  Height, weight, and BMI.  Blood pressure.  Lipid and cholesterol levels. These may be checked every 5 years, or more frequently if you are over 81 years old.  Skin check.  Lung cancer screening. You may have this screening every year starting at age 78 if you have a 30-pack-year history of smoking and currently smoke or have quit within the past 15 years.  Fecal occult blood test (FOBT) of the stool. You may have this test every year starting at age 65.  Flexible sigmoidoscopy or colonoscopy. You may have a sigmoidoscopy every 5 years or a colonoscopy  every 10 years starting at age 30.  Hepatitis C blood test.  Hepatitis B blood test.  Sexually transmitted disease (STD) testing.  Diabetes screening. This is done by checking your blood sugar (glucose) after you have not eaten for a while (fasting). You may have this done every 1-3 years.  Mammogram. This may be done every 1-2 years. Talk to your health care provider about when you should start having regular mammograms. This may depend on whether you have a family history of breast cancer.  BRCA-related cancer screening. This may be done if you have a family history of breast, ovarian, tubal, or peritoneal cancers.  Pelvic exam and Pap test. This may be done every 3 years starting at age 80. Starting at age 36, this may be done every 5 years if you have a Pap test in combination with an HPV test.  Bone density scan. This is done to screen for osteoporosis. You may have this scan if you are at high risk for osteoporosis.  Discuss your test results, treatment options, and if necessary, the need for more tests with your health care provider. Vaccines Your health care provider may recommend certain vaccines, such as:  Influenza vaccine. This is recommended every year.  Tetanus, diphtheria, and acellular pertussis (Tdap, Td) vaccine. You may need a Td booster every 10 years.  Varicella vaccine. You may need this if you have not been vaccinated.  Zoster vaccine. You may need this after age 5.  Measles, mumps, and rubella (MMR) vaccine. You may need at least one dose of MMR if you were born in  1957 or later. You may also need a second dose.  Pneumococcal 13-valent conjugate (PCV13) vaccine. You may need this if you have certain conditions and were not previously vaccinated.  Pneumococcal polysaccharide (PPSV23) vaccine. You may need one or two doses if you smoke cigarettes or if you have certain conditions.  Meningococcal vaccine. You may need this if you have certain  conditions.  Hepatitis A vaccine. You may need this if you have certain conditions or if you travel or work in places where you may be exposed to hepatitis A.  Hepatitis B vaccine. You may need this if you have certain conditions or if you travel or work in places where you may be exposed to hepatitis B.  Haemophilus influenzae type b (Hib) vaccine. You may need this if you have certain conditions.  Talk to your health care provider about which screenings and vaccines you need and how often you need them. This information is not intended to replace advice given to you by your health care provider. Make sure you discuss any questions you have with your health care provider. Document Released: 02/10/2015 Document Revised: 10/04/2015 Document Reviewed: 11/15/2014 Elsevier Interactive Patient Education  2018 Elsevier Inc.  

## 2017-10-13 LAB — CYTOLOGY - PAP
Diagnosis: NEGATIVE
HPV (WINDOPATH): NOT DETECTED

## 2017-10-22 ENCOUNTER — Ambulatory Visit
Admission: RE | Admit: 2017-10-22 | Discharge: 2017-10-22 | Disposition: A | Discharge status: Home / Self Care | Payer: Managed Care, Other (non HMO) | Source: Ambulatory Visit | Attending: Family Medicine | Admitting: Family Medicine

## 2017-10-22 DIAGNOSIS — Z1231 Encounter for screening mammogram for malignant neoplasm of breast: Secondary | ICD-10-CM | POA: Diagnosis present

## 2017-10-23 ENCOUNTER — Other Ambulatory Visit: Payer: Self-pay | Admitting: Family Medicine

## 2017-10-23 DIAGNOSIS — N6310 Unspecified lump in the right breast, unspecified quadrant: Secondary | ICD-10-CM

## 2017-10-23 DIAGNOSIS — N6320 Unspecified lump in the left breast, unspecified quadrant: Secondary | ICD-10-CM

## 2017-10-23 DIAGNOSIS — R928 Other abnormal and inconclusive findings on diagnostic imaging of breast: Secondary | ICD-10-CM

## 2017-11-03 ENCOUNTER — Telehealth: Payer: Self-pay

## 2017-11-03 DIAGNOSIS — Z111 Encounter for screening for respiratory tuberculosis: Secondary | ICD-10-CM

## 2017-11-03 NOTE — Telephone Encounter (Signed)
Copied from [place] [id number]. 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 Please advise. >> Nov 03, 2017 12:08 PM Samson Frederic wrote: Is dr Ancil Boozer willing to draw up lab work for this? Please advise

## 2017-11-03 NOTE — Telephone Encounter (Signed)
Ordered test. Okay to have it done tomorrow

## 2017-11-04 ENCOUNTER — Ambulatory Visit
Admission: RE | Admit: 2017-11-04 | Discharge: 2017-11-04 | Disposition: A | Discharge status: Home / Self Care | Payer: Managed Care, Other (non HMO) | Source: Ambulatory Visit | Attending: Family Medicine | Admitting: Family Medicine

## 2017-11-04 DIAGNOSIS — R928 Other abnormal and inconclusive findings on diagnostic imaging of breast: Secondary | ICD-10-CM

## 2017-11-04 DIAGNOSIS — N6320 Unspecified lump in the left breast, unspecified quadrant: Secondary | ICD-10-CM | POA: Diagnosis present

## 2017-11-04 DIAGNOSIS — N6310 Unspecified lump in the right breast, unspecified quadrant: Secondary | ICD-10-CM

## 2017-11-04 NOTE — Telephone Encounter (Signed)
Spoke with patient and she will stop by sometime this morning

## 2017-11-06 LAB — QUANTIFERON-TB GOLD PLUS
Mitogen-NIL: 9.11 IU/mL
NIL: 0.02 [IU]/mL
QUANTIFERON-TB GOLD PLUS: NEGATIVE
TB1-NIL: 0 IU/mL
TB2-NIL: 0 IU/mL

## 2018-03-12 ENCOUNTER — Ambulatory Visit (INDEPENDENT_AMBULATORY_CARE_PROVIDER_SITE_OTHER): Payer: Managed Care, Other (non HMO) | Admitting: Family Medicine

## 2018-03-12 ENCOUNTER — Encounter: Payer: Self-pay | Admitting: Family Medicine

## 2018-03-12 VITALS — BP 114/68 | HR 98 | Temp 98.3°F | Resp 16 | Ht 63.0 in | Wt 133.3 lb

## 2018-03-12 DIAGNOSIS — K219 Gastro-esophageal reflux disease without esophagitis: Secondary | ICD-10-CM | POA: Diagnosis not present

## 2018-03-12 DIAGNOSIS — F325 Major depressive disorder, single episode, in full remission: Secondary | ICD-10-CM | POA: Diagnosis not present

## 2018-03-12 DIAGNOSIS — F419 Anxiety disorder, unspecified: Secondary | ICD-10-CM | POA: Diagnosis not present

## 2018-03-12 MED ORDER — SERTRALINE HCL 100 MG PO TABS: ORAL_TABLET | ORAL | 1 refills | Status: DC

## 2018-03-12 MED ORDER — BUPROPION HCL ER (XL) 150 MG PO TB24: 150.0000 mg | ORAL_TABLET | Freq: Every day | ORAL | 1 refills | Status: DC

## 2018-03-12 NOTE — Progress Notes (Signed)
Name: Gwendolyn Hull   MRN: [id number]    DOB: 05-14-1975   Date:03/12/2018       Progress Note  Subjective  Chief Complaint  Chief Complaint  Patient presents with  . Medication Refill    6 month followup  . Depression  . Anxiety  . Gastroesophageal Reflux    HPI  Major Depression: she has a long history of depression.She came in early March 2019 with worsening of symptoms. Phq9 was 12, she was on Zoloft and we added Wellbutrin XL , she also saw therapist for period of time, had marital issues also, he moved back Nov 2019, not back to normal but doing okay. Trying to get closer again. Youngest son is doing well in school. 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 No side effects of medication  GERD: doing well, no symptoms. Unchanged  Dyslipidemia:discussed life style modifications  The 10-year ASCVD risk score Mikey Bussing DC Jr., et al., 2013) is: 0.6%   Values used to calculate the score:     Age: 43 years     Sex: Female     Is Non-Hispanic African American: No     Diabetic: No     Tobacco smoker: No     Systolic Blood Pressure: 976 mmHg     Is BP treated: No     HDL Cholesterol: 63 mg/dL     Total Cholesterol: 232 mg/dL  Patient Active Problem List   Diagnosis Date Noted  . GERD (gastroesophageal reflux disease) 04/04/2017  . Major depression, recurrent, chronic ([place]) 04/04/2017  . Anxiety 04/04/2017  . Seasonal allergic rhinitis 04/04/2017  . Allergy 01/29/1995    Past Surgical History:  Procedure Laterality Date  . CESAREAN SECTION  01/1998, 10/1999, 04/2002    Family History  Problem Relation Age of Onset  . Breast cancer Mother 9  . Cancer Mother        breast cancer 42 (age 39), tongue cancer 2011  . Diabetes Mother   . Colon cancer Maternal Grandmother   . Depression Brother   . Diabetes Father   . Hearing loss Father   . Heart disease Maternal Grandfather   . Cancer Paternal Grandfather    . Diabetes Paternal Grandfather     Social History   Socioeconomic History  . Marital status: Married    Spouse name: Rodman Key  . Number of children: 3  . Years of education: Not on file  . Highest education level: Bachelor's degree (e.g., BA, AB, BS)  Occupational History  . Occupation: Pharmacist, hospital   Social Needs  . Financial resource strain: Not hard at all  . Food insecurity:    Worry: Never true    Inability: Never true  . Transportation needs:    Medical: No    Non-medical: No  Tobacco Use  . Smoking status: Never Smoker  . Smokeless tobacco: Never Used  Substance and Sexual Activity  . Alcohol use: Yes    Comment: occasional drinks, no more than 3 in an evening  . Drug use: No  . Sexual activity: Yes    Partners: Male    Birth control/protection: Condom  Lifestyle  . Physical activity:    Days per week: 4 days    Minutes per session: 50 min  . Stress: Not at all  Relationships  . Social connections:    Talks on phone: More than three times a week    Gets together:  Twice a week    Attends religious service: Never    Active member of club or organization: Yes    Attends meetings of clubs or organizations: More than 4 times per year    Relationship status: Married  . Intimate partner violence:    Fear of current or ex partner: No    Emotionally abused: No    Physically abused: No    Forced sexual activity: No  Other Topics Concern  . 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  buPROPion (WELLBUTRIN XL) 150 MG 24 hr tablet, Take 1 tablet (150 mg total) by mouth daily.,  Disp: 90 tablet, Rfl: 1 .  sertraline (ZOLOFT) 100 MG tablet, Daily, Disp: 90 tablet, Rfl: 1  No Known Allergies  I personally reviewed active problem list, medication list, allergies, family history, social history with the patient/caregiver today.   ROS  Constitutional: Negative for fever or weight change.  Respiratory: Negative for cough and shortness of breath.   Cardiovascular: Negative for chest pain or palpitations.  Gastrointestinal: Negative for abdominal pain, no bowel changes.  Musculoskeletal: Negative for gait problem or joint swelling.  Skin: Negative for rash.  Neurological: Negative for dizziness or headache.  No other specific complaints in a complete review of systems (except as listed in HPI above).  Objective  Vitals:   03/12/18 0858  BP: 114/68  Pulse: 98  Resp: 16  Temp: 98.3 F (36.8 C)  TempSrc: Oral  SpO2: 97%  Weight: 133 lb 4.8 oz (60.5 kg)  Height: 5\' 3"  (1.6 m)    Body mass index is 23.61 kg/m.  Physical Exam  Constitutional: Patient appears well-developed and well-nourished.  No distress.  HEENT: head atraumatic, normocephalic, pupils equal and reactive to light, neck supple, throat within normal limits Cardiovascular: Normal rate, regular rhythm and normal heart sounds.  No murmur heard. No BLE edema. Pulmonary/Chest: Effort normal and breath sounds normal. No respiratory distress. Abdominal: Soft.  There is no tenderness. Psychiatric: Patient has a normal mood and affect. behavior is normal. Judgment and thought content normal.  PHQ2/9: Depression screen [facility] 2/9 03/12/2018 10/09/2017 09/09/2017 04/21/2017 04/04/2017  Decreased Interest 0 0 0 0 1  Down, Depressed, Hopeless 0 0 0 0 1  PHQ - 2 Score 0 0 0 0 2  Altered sleeping 0 0 0 0 1  Tired, decreased energy 0 0 1 0 3  Change in appetite 0 0 0 0 2  Feeling bad or failure about yourself  0 0 1 0 3  Trouble concentrating 0 1 0 0 1  Moving slowly or fidgety/restless 0 0 0 0 0  Suicidal  thoughts 0 - 0 0 0  PHQ-9 Score 0 1 2 0 12  Difficult doing work/chores Not difficult at all Not difficult at all - Not difficult at all Somewhat difficult   GAD 7 : Generalized Anxiety Score 03/12/2018 10/09/2017 09/09/2017 04/21/2017  Nervous, Anxious, on Edge 0 0 1 0  Control/stop  worrying 0 0 0 0  Worry too much - different things 0 0 1 0  Trouble relaxing 0 0 0 1  Restless 0 0 0 0  Easily annoyed or irritable 0 0 0 0  Afraid - awful might happen 0 0 0 0  Total GAD 7 Score 0 0 2 1  Anxiety Difficulty Not difficult at all Not difficult at all - Not difficult at all     Fall Risk: Fall Risk  04/21/2017 04/04/2017 04/04/2017  Falls in the past year? No No No      Assessment & Plan  1. Major depression in remission (HCC)  - buPROPion (WELLBUTRIN XL) 150 MG 24 hr tablet; Take 1 tablet (150 mg total) by mouth daily.  Dispense: 90 tablet; Refill: 1 - sertraline (ZOLOFT) 100 MG tablet; Daily  Dispense: 90 tablet; Refill: 1  She will try weaning down on Zoloft to 50 mg since she is feeling well, with instruction to go back to 100 mg if she noticed mood changes  2. Gastroesophageal reflux disease without esophagitis  Doing well  3. Anxiety  - buPROPion (WELLBUTRIN XL) 150 MG 24 hr tablet; Take 1 tablet (150 mg total) by mouth daily.  Dispense: 90 tablet; Refill: 1 - sertraline (ZOLOFT) 100 MG tablet; Daily  Dispense: 90 tablet; Refill: 1

## 2018-05-24 ENCOUNTER — Telehealth: Payer: Managed Care, Other (non HMO) | Admitting: Physician Assistant

## 2018-05-24 DIAGNOSIS — W540XXA Bitten by dog, initial encounter: Secondary | ICD-10-CM

## 2018-05-24 DIAGNOSIS — T148XXA Other injury of unspecified body region, initial encounter: Secondary | ICD-10-CM

## 2018-05-24 NOTE — Progress Notes (Signed)
Based on what you shared with me, I feel your condition warrants further evaluation and I recommend that you be seen for a face to face office visit.     NOTE: If you entered your credit card information for this eVisit, you will not be charged. You may see a "hold" on your card for the $35 but that hold will drop off and you will not have a charge processed.  If you are having a true medical emergency please call 911.  If you need an urgent face to face visit, [place] has four urgent care centers for your convenience.    PLEASE NOTE: THE [facility]STACARE LOCATIONS AND URGENT CARE CLINICS DO NOT HAVE THE TESTING FOR CORONAVIRUS COVID19 AVAILABLE.  IF YOU FEEL YOU NEED THIS TEST YOU MUST GO TO A TRIAGE LOCATION AT [place] ?  [email or web address] to reserve your spot online an avoid wait times  [facility] [address] [place], [place] [address] Modified hours of operation: Monday-Friday, 10 AM to 6 PM  Saturday & Sunday 10 AM to 4 PM *Across the street from [place] (New Address!) [address] [place], [place] [address] *Just off [address], across the road from [place] hours of operation: Monday-Friday, 10 AM to 5 PM  Closed Saturday & Sunday   The following sites will take your insurance:  [facility] a Provider at this Location  [place], [place] [address] 10 am to 8 pm Monday-Friday 12 pm to 8 pm [facility] a Provider at this Location  [place], [place], [place] [address] 8 am to 8 pm Monday-Friday 9 am to 6 pm Saturday 11 am to 6 pm Sunday   [facility]  [phone] Get Driving Directions  [address] [place], [place] [address] 8 am to 8 pm Monday-Friday 8 am to 4 pm Saturday-Sunday   Your e-visit answers were reviewed by a board certified advanced clinical practitioner to complete your personal care plan.  Thank you for using e-Visits.  ===View-only below this line===   ----- Message -----    From: Gwendolyn Hull    Sent: 05/24/2018  5:49 PM EDT      To: E-Visit Mailing List Subject: E-Visit Submission: Insect Sting  E-Visit Submission: Insect Sting --------------------------------  Question: What insect stung you? Answer:   Bee  Question: When did you get stung? Answer:   Today  Question: How many times were you stung? Answer:   Once  Question: Please tell me the location of the sting(s): Answer:   Sorry- this was the best option I could find for my situation. I was bitten by a dog while trimming his nails. The skin is broken and area around the bite marks is bruised.  Question: Did you remove the stinger(s)? Answer:   No  Question: Do you have any redness, itchiness, or swelling around the sting? Answer:   Redness and swelling  Question: Do you have any of the following since being stung? (check all that apply) Answer:   None of the above  Question: "Other" symptoms or comments: Answer:     Question: Do you have a fever? Answer:   No  Question: Is the sting area(s) painful? Answer:   Yes  Question: Please rate your pain in a scale of 1-10 with 0 being the least and 10 being the worst: Answer:   3  Question: Have you tried anything over the counter? (check all that apply) Answer:   Ibuprofen  Question: Have you washed the area(s) with soap and water? Answer:   Yes  Question: Is there any pus draining from the sting(s)? Answer:   No  Question: Please list your medication allergies that you may have ? (If 'none' , please list as 'none') Answer:   None  Question: Please list any additional comments  Answer:     Question: Are you  pregnant? Answer:   I am confident that I am not pregnant  Question: Are you breastfeeding? Answer:   No

## 2018-05-25 ENCOUNTER — Encounter: Payer: Self-pay | Admitting: Family Medicine

## 2018-05-25 ENCOUNTER — Ambulatory Visit (INDEPENDENT_AMBULATORY_CARE_PROVIDER_SITE_OTHER): Payer: Managed Care, Other (non HMO) | Admitting: Family Medicine

## 2018-05-25 ENCOUNTER — Other Ambulatory Visit: Payer: Self-pay

## 2018-05-25 DIAGNOSIS — T148XXA Other injury of unspecified body region, initial encounter: Secondary | ICD-10-CM

## 2018-05-25 DIAGNOSIS — W540XXA Bitten by dog, initial encounter: Secondary | ICD-10-CM

## 2018-05-25 MED ORDER — AMOXICILLIN-POT CLAVULANATE 875-125 MG PO TABS: 1.0000 | ORAL_TABLET | Freq: Two times a day (BID) | ORAL | 0 refills | Status: AC

## 2018-05-25 NOTE — Progress Notes (Signed)
Name: Gwendolyn Hull   MRN: [id number]    DOB: Jun 01, 1975   Date:05/25/2018       Progress Note  Subjective  Chief Complaint  Chief Complaint  Patient presents with  . Animal Bite    on yesterday left forearm, foster dog, up to date on vaccines    I connected with  Arbie Cookey Tavis  on 05/25/18 at 10:00 AM EDT by a video enabled telemedicine application and verified that I am speaking with the correct person using two identifiers.  I discussed the limitations of evaluation and management by telemedicine and the availability of in person appointments. The patient expressed understanding and agreed to proceed. Staff also discussed with the patient that there may be a patient responsible charge related to this service. Patient Location: Home Provider Location: Home Additional Individuals present: None  HPI  Pt presents with concern for dog bite - she is fostering a dog from the shelter, was trying to do his nails, got too close to the quick, and he bit the patient on the LEFT forearm.  There is no redness, swelling, or purulent drainage, area has scabbed.  No fevers chills, no loss of strength or motion in LEFT arm or hand.  [facility] are coming today - she called yesterday immediately after the bite, and they are coming today to remove him from the home.  Dog is UTD on all vaccines including rabies.   Patient Active Problem List   Diagnosis Date Noted  . GERD (gastroesophageal reflux disease) 04/04/2017  . Major depression, recurrent, chronic ([place]) 04/04/2017  . Anxiety 04/04/2017  . Seasonal allergic rhinitis 04/04/2017  . Allergy 01/29/1995    Social History   Tobacco Use  . Smoking status: Never Smoker  . Smokeless tobacco: Never Used  Substance Use Topics  . Alcohol use: Yes    Comment: occasional drinks, no more than 3 in an evening     Current Outpatient Medications:  .  buPROPion (WELLBUTRIN XL) 150 MG 24 hr tablet, Take 1 tablet (150 mg total) by mouth  daily., Disp: 90 tablet, Rfl: 1 .  sertraline (ZOLOFT) 100 MG tablet, Daily, Disp: 90 tablet, Rfl: 1  No Known Allergies  I personally reviewed active problem list, medication list, allergies, notes from last encounter, lab results with the patient/caregiver today.  ROS  Constitutional: Negative for fever or weight change.  Respiratory: Negative for cough and shortness of breath.   Cardiovascular: Negative for chest pain or palpitations.  Gastrointestinal: Negative for abdominal pain, no bowel changes.  Musculoskeletal: Negative for gait problem or joint swelling.  Skin: Negative for rash. See HPI Neurological: Negative for dizziness or headache.  No other specific complaints in a complete review of systems (except as listed in HPI above).   Objective  Virtual encounter, vitals not obtained.  There is no height or weight on file to calculate BMI.  Nursing Note and Vital Signs reviewed.  Physical Exam  Constitutional: Patient appears well-developed and well-nourished. No distress.  HENT: Head: Normocephalic and atraumatic.  Neck: Normal range of motion. Pulmonary/Chest: Effort normal. No respiratory distress. Speaking in complete sentences Neurological: Pt is alert and oriented to person, place, and time. Coordination, speech and gait are normal.  Psychiatric: Patient has a normal mood and affect. behavior is normal. Judgment and thought content normal. Skin: LEFT distal forearm on the radial aspect exhibits small puncture wound with dark red dried sanguinous exudate, no underlying erythema, pt denies tenderness around the site. MSK: Pt denies  weakness in LEFT arm, there is full AROM of the LUE, hand, and fingers.  No results found for this or any previous visit (from the past 72 hour(s)).  Assessment & Plan  1. Dog bite, initial encounter - She has already reported to animal control and the dog is being removed from the home and taken back to the shelter today.  She is  advised of signs and symptoms of rabies and infection and will call us if either should occur.  The animal is UTD on rabies vaccination, and the bite appears superficial, so we will avoid IgG/vaccine for the patient at this time; the area is on the radial aspect and distal (close to joint and bone), so we will treat with prophylactic antibiotic. - amoxicillin-clavulanate (AUGMENTIN) 875-125 MG tablet; Take 1 tablet by mouth 2 (two) times daily for 10 days.  Dispense: 20 tablet; Refill: 0  -Red flags and when to present for emergency care or RTC including fever >101.31F, chest pain, shortness of breath, new/worsening/un-resolving symptoms, reviewed with patient at time of visit. Follow up and care instructions discussed and provided in AVS. - I discussed the assessment and treatment plan with the patient. The patient was provided an opportunity to ask questions and all were answered. The patient agreed with the plan and demonstrated an understanding of the instructions.  I provided 17 minutes of non-face-to-face time during this encounter.  Hubbard Hartshorn, FNP

## 2018-09-02 ENCOUNTER — Encounter: Payer: Self-pay | Admitting: Family Medicine

## 2018-09-10 ENCOUNTER — Encounter: Payer: Self-pay | Admitting: Family Medicine

## 2018-09-10 ENCOUNTER — Ambulatory Visit (INDEPENDENT_AMBULATORY_CARE_PROVIDER_SITE_OTHER): Payer: Self-pay | Admitting: Family Medicine

## 2018-09-10 ENCOUNTER — Other Ambulatory Visit: Payer: Self-pay

## 2018-09-10 DIAGNOSIS — F419 Anxiety disorder, unspecified: Secondary | ICD-10-CM

## 2018-09-10 DIAGNOSIS — F330 Major depressive disorder, recurrent, mild: Secondary | ICD-10-CM

## 2018-09-10 MED ORDER — SERTRALINE HCL 100 MG PO TABS: ORAL_TABLET | ORAL | 1 refills | Status: DC

## 2018-09-10 MED ORDER — BUPROPION HCL ER (XL) 150 MG PO TB24: 150.0000 mg | ORAL_TABLET | Freq: Every day | ORAL | 1 refills | Status: DC

## 2018-09-10 NOTE — Progress Notes (Addendum)
Name: Gwendolyn Hull   MRN: [id number]    DOB: August 05, 1975   Date:09/10/2018       Progress Note  Subjective  Chief Complaint  Chief Complaint  Patient presents with  . Anxiety  . Depression  . Gastroesophageal Reflux    HPI  Major Depression: she has a long history of depression.She came in early March 2019 with worsening of symptoms. Phq9 was 12 and even with all changes is only 2 today , she is on Zoloft and  Wellbutrin XL and seems to be working, she also saw therapist for period of time and needs to go back to discuss changes , had marital issues also, he moved back Nov 2019, she separated from [facility] 08/2018, she told him she was having an affair two years ago, she moved out, told her children about the affair and separation, and middle son is struggling the most.   She was subbing, currently she is working at [employer] now.   GERD: doing well, no symptoms.Unchanged  Dyslipidemia:discussed life style modifications  The 10-year ASCVD risk score Mikey Bussing DC Jr., et al., 2013) is: 0.7%   Values used to calculate the score:     Age: 3 years     Sex: Female     Is Non-Hispanic African American: No     Diabetic: No     Tobacco smoker: No     Systolic Blood Pressure: 740 mmHg     Is BP treated: No     HDL Cholesterol: 63 mg/dL     Total Cholesterol: 232 mg/dL   Patient Active Problem List   Diagnosis Date Noted  . GERD (gastroesophageal reflux disease) 04/04/2017  . Major depression, recurrent, chronic ([place]) 04/04/2017  . Anxiety 04/04/2017  . Seasonal allergic rhinitis 04/04/2017  . Allergy 01/29/1995    Past Surgical History:  Procedure Laterality Date  . CESAREAN SECTION  01/1998, 10/1999, 04/2002    Family History  Problem Relation Age of Onset  . Breast cancer Mother 20  . Cancer Mother        breast cancer 50 (age 37), tongue cancer 2011  . Diabetes Mother   . Colon cancer Maternal Grandmother   . Depression Brother   . Diabetes Father   . Hearing loss Father    . Heart disease Maternal Grandfather   . Cancer Paternal Grandfather   . Diabetes Paternal Grandfather     Social History   Socioeconomic History  . Marital status: Married    Spouse name: Rodman Key  . Number of children: 3  . Years of education: Not on file  . Highest education level: Bachelor's degree (e.g., BA, AB, BS)  Occupational History  . Occupation: Pharmacist, hospital   Social Needs  . Financial resource strain: Not hard at all  . Food insecurity    Worry: Never true    Inability: Never true  . Transportation needs    Medical: No    Non-medical: No  Tobacco Use  . Smoking status: Never Smoker  . Smokeless tobacco: Never Used  Substance and Sexual Activity  . Alcohol use: Yes    Comment: occasional drinks, no more than 3 in an evening  . Drug use: No  . Sexual activity: Yes    Partners: Male    Birth control/protection: Condom  Lifestyle  . Physical activity    Days per week: 4 days    Minutes per session: 50 min  . Stress: Not at all  Relationships  . Social connections  Talks on phone: More than three times a week    Gets together: Twice a week    Attends religious service: Never    Active member of club or organization: Yes    Attends meetings of clubs or organizations: More than 4 times per year    Relationship status: Married  . Intimate partner violence    Fear of current or ex partner: No    Emotionally abused: No    Physically abused: No    Forced sexual activity: No  Other Topics Concern  . 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    Two older sons in college in [place]      Current Outpatient Medications:  .  buPROPion (WELLBUTRIN XL) 150 MG 24 hr tablet,  Take 1 tablet (150 mg total) by mouth daily., Disp: 90 tablet, Rfl: 1 .  sertraline (ZOLOFT) 100 MG tablet, Daily, Disp: 90 tablet, Rfl: 1  No Known Allergies  I personally reviewed active problem list, medication list, allergies, family history, social history with the patient/caregiver today.   ROS  Ten systems reviewed and is negative except as mentioned in HPI   Objective  Vitals:   09/10/18 0909  BP: 120/80  Pulse: 85  Resp: 16  Temp: (!) 97.3 F (36.3 C)  TempSrc: Temporal  SpO2: 97%  Weight: 125 lb 11.2 oz (57 kg)  Height: 5\' 3"  (1.6 m)    Body mass index is 22.27 kg/m.  Physical Exam  Constitutional: Patient appears well-developed and well-nourished.  No distress.  HEENT: head atraumatic, normocephalic, pupils equal and reactive to light,  neck supple, throat within normal limits Cardiovascular: Normal rate, regular rhythm and normal heart sounds.  No murmur heard. No BLE edema. Pulmonary/Chest: Effort normal and breath sounds normal. No respiratory distress. Abdominal: Soft.  There is no tenderness. Psychiatric: Patient has a normal mood and affect. behavior is normal. Judgment and thought content normal. Cried a little during visit   PHQ2/9: Depression screen [facility] 2/9 09/10/2018 09/10/2018 05/25/2018 03/12/2018 10/09/2017  Decreased Interest 0 0 0 0 0  Down, Depressed, Hopeless 0 0 0 0 0  PHQ - 2 Score 0 0 0 0 0  Altered sleeping 1 0 0 0 0  Tired, decreased energy 0 0 0 0 0  Change in appetite 0 0 0 0 0  Feeling bad or failure about yourself  1 0 0 0 0  Trouble concentrating 0 0 0 0 1  Moving slowly or fidgety/restless 0 0 0 0 0  Suicidal thoughts 0 0 0 0 -  PHQ-9 Score 2 0 0 0 1  Difficult doing work/chores Not difficult at all - Not difficult at all Not difficult at all Not difficult at all    phq 9 is positive   Fall Risk: Fall Risk  09/10/2018 05/25/2018 04/21/2017 04/04/2017 04/04/2017  Falls in the past year? 0 0 No No No  Number falls in past yr: 0 0 -  - -  Injury with Fall? 0 0 - - -  Follow up - Falls evaluation completed - - -     Functional Status  Survey: Is the patient deaf or have difficulty hearing?: No Does the patient have difficulty seeing, even when wearing glasses/contacts?: No Does the patient have difficulty concentrating, remembering, or making decisions?: No Does the patient have difficulty walking or climbing stairs?: No Does the patient have difficulty dressing or bathing?: No Does the patient have difficulty doing errands alone such as visiting a doctor's office or shopping?: No    Assessment & Plan   1. Anxiety  - buPROPion (WELLBUTRIN XL) 150 MG 24 hr tablet; Take 1 tablet (150 mg total) by mouth daily.  Dispense: 90 tablet; Refill: 1 - sertraline (ZOLOFT) 100 MG tablet; Daily  Dispense: 90 tablet; Refill: 1  2. Major depression recurrent Mild (HCC)  - buPROPion (WELLBUTRIN XL) 150 MG 24 hr tablet; Take 1 tablet (150 mg total) by mouth daily.  Dispense: 90 tablet; Refill: 1 - sertraline (ZOLOFT) 100 MG tablet; Daily  Dispense: 90 tablet; Refill: 1  Discussed STI screen, she is self pay, advised to check into point of care at [facility] , or health department

## 2018-09-16 ENCOUNTER — Ambulatory Visit: Payer: Self-pay | Admitting: Physician Assistant

## 2018-09-16 ENCOUNTER — Other Ambulatory Visit: Payer: Self-pay

## 2018-09-16 ENCOUNTER — Encounter: Payer: Self-pay | Admitting: Physician Assistant

## 2018-09-16 DIAGNOSIS — Z113 Encounter for screening for infections with a predominantly sexual mode of transmission: Secondary | ICD-10-CM

## 2018-09-16 LAB — WET PREP FOR TRICH, YEAST, CLUE
Trichomonas Exam: NEGATIVE
Yeast Exam: NEGATIVE

## 2018-09-16 NOTE — Progress Notes (Signed)
    STI clinic/screening visit  Subjective:  Gwendolyn Hull is a 43 y.o. female being seen today for an STI screening visit. The patient reports they do not have symptoms.  Patient has the following medical conditions:   Patient Active Problem List   Diagnosis Date Noted  . GERD (gastroesophageal reflux disease) 04/04/2017  . Major depression, recurrent, chronic ([place]) 04/04/2017  . Anxiety 04/04/2017  . Seasonal allergic rhinitis 04/04/2017  . Allergy 01/29/1995     Chief Complaint  Patient presents with  . SEXUALLY TRANSMITTED DISEASE    HPI  Patient reports that she is not having any symptoms and would like a screening.  Has new partner and wants to make sure everything is ok. Using condoms for [facility].   See flowsheet for further details and programmatic requirements.    The following portions of the patient's history were reviewed and updated as appropriate: allergies, current medications, past medical history, past social history, past surgical history and problem list.  Objective:  There were no vitals filed for this visit.  Physical Exam Constitutional:      General: She is not in acute distress.    Appearance: Normal appearance.  HENT:     Head: Normocephalic and atraumatic.     Mouth/Throat:     Mouth: Mucous membranes are moist.     Pharynx: No oropharyngeal exudate or posterior oropharyngeal erythema.  Eyes:     Conjunctiva/sclera: Conjunctivae normal.  Neck:     Musculoskeletal: Neck supple.  Pulmonary:     Effort: Pulmonary effort is normal.  Abdominal:     Palpations: Abdomen is soft. There is no mass.     Tenderness: There is no abdominal tenderness. There is no guarding or rebound.  Genitourinary:    General: Normal vulva.     Rectum: Normal.     Comments: External genitalia/pubic area without nits, lice, edema, erythema, lesions or inguinal adenopathy. Vagina with normal discharge and mucosa. Cervix without visible lesions. Uterus normal size,  firm, mobile, nt, no masses, no CMT, no adnexal tenderness or fullness. Lymphadenopathy:     Cervical: No cervical adenopathy.  Skin:    General: Skin is warm and dry.     Findings: No bruising, erythema, lesion or rash.  Neurological:     Mental Status: She is alert and oriented to person, place, and time.  Psychiatric:        Mood and Affect: Mood normal.        Behavior: Behavior normal.        Thought Content: Thought content normal.        Judgment: Judgment normal.       Assessment and Plan:  Gwendolyn Hull is a 43 y.o. female presenting to the [facility] Department for STI screening  1. Screening for STD (sexually transmitted disease) Patient is without symptoms today. Rec condoms with all sex Await test results.  Counseled patient that RN will call if needs to RTC for any treatment once results are back.  - WET PREP FOR [place], YEAST, [place] [facility] - Syphilis Serology, [facility]     No follow-ups on file.  Future Appointments  Date Time Provider [place]  03/12/2019  8:00 AM Steele Sizer, MD [place], [place]

## 2018-09-21 ENCOUNTER — Other Ambulatory Visit: Payer: Self-pay | Admitting: Family Medicine

## 2018-09-21 DIAGNOSIS — F330 Major depressive disorder, recurrent, mild: Secondary | ICD-10-CM

## 2018-09-21 DIAGNOSIS — F419 Anxiety disorder, unspecified: Secondary | ICD-10-CM

## 2018-10-20 ENCOUNTER — Encounter: Payer: Self-pay | Admitting: Family Medicine

## 2018-10-20 ENCOUNTER — Other Ambulatory Visit: Payer: Self-pay | Admitting: Family Medicine

## 2018-10-20 DIAGNOSIS — F330 Major depressive disorder, recurrent, mild: Secondary | ICD-10-CM

## 2018-10-20 DIAGNOSIS — F419 Anxiety disorder, unspecified: Secondary | ICD-10-CM

## 2018-10-20 MED ORDER — SERTRALINE HCL 100 MG PO TABS: ORAL_TABLET | ORAL | 1 refills | Status: DC

## 2018-10-20 MED ORDER — BUPROPION HCL ER (XL) 150 MG PO TB24: 150.0000 mg | ORAL_TABLET | Freq: Every day | ORAL | 1 refills | Status: DC

## 2018-12-22 ENCOUNTER — Encounter: Payer: Self-pay | Admitting: Family Medicine

## 2019-03-05 ENCOUNTER — Encounter: Payer: Self-pay | Admitting: Family Medicine

## 2019-03-12 ENCOUNTER — Encounter: Payer: Self-pay | Admitting: Family Medicine

## 2019-03-12 ENCOUNTER — Ambulatory Visit (INDEPENDENT_AMBULATORY_CARE_PROVIDER_SITE_OTHER): Payer: 59 | Admitting: Family Medicine

## 2019-03-12 ENCOUNTER — Other Ambulatory Visit: Payer: Self-pay

## 2019-03-12 VITALS — BP 112/70 | HR 86 | Temp 97.3°F | Resp 14 | Ht 63.0 in | Wt 128.0 lb

## 2019-03-12 DIAGNOSIS — F325 Major depressive disorder, single episode, in full remission: Secondary | ICD-10-CM

## 2019-03-12 DIAGNOSIS — J302 Other seasonal allergic rhinitis: Secondary | ICD-10-CM | POA: Diagnosis not present

## 2019-03-12 DIAGNOSIS — F419 Anxiety disorder, unspecified: Secondary | ICD-10-CM | POA: Diagnosis not present

## 2019-03-12 MED ORDER — SERTRALINE HCL 100 MG PO TABS: ORAL_TABLET | ORAL | 1 refills | Status: DC

## 2019-03-12 MED ORDER — BUPROPION HCL ER (XL) 150 MG PO TB24: 150.0000 mg | ORAL_TABLET | Freq: Every day | ORAL | 1 refills | Status: DC

## 2019-03-12 NOTE — Progress Notes (Signed)
Name: Gwendolyn Hull   MRN: [id number]    DOB: 10-25-1975   Date:03/12/2019       Progress Note  Subjective  Chief Complaint  Chief Complaint  Patient presents with  . 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 She states her middle son is not accepting, but youngest son has a good relationship with her, oldest is okay.   AR: it is seasonal, only taking otc medication and is doing well at this time  Patient Active Problem List   Diagnosis Date Noted  . GERD (gastroesophageal reflux disease) 04/04/2017  . Major depression, recurrent, chronic ([place]) 04/04/2017  . Anxiety 04/04/2017  . Seasonal allergic rhinitis 04/04/2017  . Allergy 01/29/1995    Past Surgical History:  Procedure Laterality Date  . CESAREAN SECTION  01/1998, 10/1999, 04/2002    Family History  Problem Relation Age of Onset  . Breast cancer Mother 68  . Cancer Mother        breast cancer 61 (age 33), tongue cancer 2011  . Diabetes Mother   . Colon cancer Maternal Grandmother   . Depression Brother   . Diabetes Father   . Hearing loss Father   . Heart disease Maternal Grandfather   . Cancer Paternal Grandfather   . Diabetes Paternal Grandfather     Social History   Tobacco Use  . Smoking status: Never Smoker  . Smokeless tobacco: Never Used  Substance Use Topics  . Alcohol use: Yes    Comment: occasional drinks, no more than 3 in an evening  . Drug use: No     Current Outpatient Medications:  .  buPROPion (WELLBUTRIN XL) 150 MG 24 hr tablet, Take 1 tablet (150 mg total) by mouth daily.,  Disp: 90 tablet, Rfl: 1 .  sertraline (ZOLOFT) 100 MG tablet, Daily, Disp: 90 tablet, Rfl: 1  No Known Allergies  I personally reviewed active problem list, medication list, allergies, family history, social history, health maintenance with the patient/caregiver today.   ROS  Constitutional: Negative for fever or weight change.  Respiratory: Negative for cough and shortness of breath.   Cardiovascular: Negative for chest pain or palpitations.  Gastrointestinal: Negative for abdominal pain, no bowel changes.  Musculoskeletal: Negative for gait problem or joint swelling.  Skin: Negative for rash.  Neurological: Negative for dizziness or headache.  No other specific complaints in a complete review of systems (except as listed in HPI above).  Objective  Vitals:   03/12/19 0803  BP: 112/70  Pulse: 86  Resp: 14  Temp: (!) 97.3 F (36.3 C)  TempSrc: Temporal  SpO2: 97%  Weight: 128 lb (58.1 kg)  Height: 5\' 3"  (1.6 m)    Body mass index is 22.67 kg/m.  Physical Exam  Constitutional: Patient appears well-developed and well-nourished. No distress.  HEENT: head atraumatic, normocephalic, pupils equal and reactive to light Cardiovascular: Normal rate, regular rhythm and normal heart sounds.  No murmur heard. No BLE edema. Pulmonary/Chest: Effort normal and breath sounds normal. No respiratory distress. Abdominal:  Soft.  There is no tenderness. Psychiatric: Patient has a normal mood and affect. behavior is normal. Judgment and thought content normal.  PHQ2/9: Depression screen [facility] 2/9 03/12/2019 09/10/2018 09/10/2018 05/25/2018 03/12/2018  Decreased Interest 0 0 0 0 0  Down, Depressed, Hopeless 0 0 0 0 0  PHQ - 2 Score 0 0 0 0 0  Altered sleeping 0 1 0 0 0  Tired, decreased energy 0 0 0 0 0  Change in appetite 0 0 0 0 0  Feeling bad or failure about yourself  0 1 0 0 0  Trouble concentrating 0 0 0 0 0  Moving slowly or fidgety/restless 0 0 0 0 0  Suicidal thoughts 0 0 0 0 0   PHQ-9 Score 0 2 0 0 0  Difficult doing work/chores Not difficult at all Not difficult at all - Not difficult at all Not difficult at all  Some recent data might be hidden    phq 9 is negative  Fall Risk: Fall Risk  03/12/2019 09/10/2018 05/25/2018 04/21/2017 04/04/2017  Falls in the past year? 0 0 0 No No  Number falls in past yr: 0 0 0 - -  Injury with Fall? 0 0 0 - -  Follow up Falls evaluation completed - Falls evaluation completed - -     Functional Status Survey: Is the patient deaf or have difficulty hearing?: No Does the patient have difficulty seeing, even when wearing glasses/contacts?: No Does the patient have difficulty concentrating, remembering, or making decisions?: No Does the patient have difficulty walking or climbing stairs?: No Does the patient have difficulty dressing or bathing?: No Does the patient have difficulty doing errands alone such as visiting a doctor's office or shopping?: No    Assessment & Plan  1. Major depression in remission (HCC)  - sertraline (ZOLOFT) 100 MG tablet; Daily  Dispense: 90 tablet; Refill: 1 - buPROPion (WELLBUTRIN XL) 150 MG 24 hr tablet; Take 1 tablet (150 mg total) by mouth daily.  Dispense: 90 tablet; Refill: 1  2. Seasonal allergic rhinitis, unspecified trigger  Doing well at this time  3. Anxiety  - sertraline (ZOLOFT) 100 MG tablet; Daily  Dispense: 90 tablet; Refill: 1 - buPROPion (WELLBUTRIN XL) 150 MG 24 hr tablet; Take 1 tablet (150 mg total) by mouth daily.  Dispense: 90 tablet; Refill: 1

## 2019-03-21 NOTE — Progress Notes (Signed)
Steele Sizer, MD   Chief Complaint  Patient presents with  . Contraception    interested in IUD     HPI:      Ms. Gwendolyn Hull is a 44 y.o. [phone] who LMP was Patient's last menstrual period was 03/01/2019 (exact date)., presents today for NP [facility] consult, interested in non-hormonal IUD. She is sex active, no birth control. Menses are monthly, last 4 days, mod flow, no dysmen, no BTB. She did OCPs in past.  Last pap 10/09/17 was neg cells/neg HPV DNA. No hx of abn paps, no hx of STDs.  Last mammo 2019. FH breast cancer in her mom, genetic testing not indicated.   Past Medical History:  Diagnosis Date  . Allergy 1997   mild, seasonal  . Anxiety fall 1996   concurrent with depression; see dates above  . Depression fall 1996   episodes fall 1996, and postpartum fall 2000 & summer 2002  . GERD (gastroesophageal reflux disease) 2010(?)   occasional, mild, and stress-related    Past Surgical History:  Procedure Laterality Date  . CESAREAN SECTION  01/1998, 10/1999, 04/2002    Family History  Problem Relation Age of Onset  . Breast cancer Mother 59  . Cancer Mother        breast cancer 13 (age 22), tongue cancer 2011  . Diabetes Mother   . Colon cancer Maternal Grandmother   . Depression Brother   . Diabetes Father   . Hearing loss Father   . Heart disease Maternal Grandfather   . Cancer Paternal Grandfather   . Diabetes Paternal Grandfather     Social History   Socioeconomic History  . Marital status: Married    Spouse name: Rodman Key  . Number of children: 3  . Years of education: Not on file  . Highest education level: Bachelor's degree (e.g., BA, AB, BS)  Occupational History  . Occupation: Pharmacist, hospital   Tobacco Use  . Smoking status: Never Smoker  . Smokeless tobacco: Never Used  Substance and Sexual Activity  . Alcohol use: Yes    Comment: occasional drinks, no more than 3 in an evening  . Drug use: No  . Sexual activity: Yes    Partners: Male   Birth control/protection: Condom  Other Topics Concern  . Not on file  Social History Narrative   Married for the past 92 years   Three sons, youngest is in [place]    She was teaching full time, but now only subbing and trying to go back to job at [employer] moved back home Nov 2019, but now they are separated she confessed that she was having an affair in [place] before she moved back.    Two older sons in college in [place] Strain:   . Difficulty of Paying Living Expenses: Not on file  Food Insecurity:   . Worried About Charity fundraiser in the Last Year: Not on file  . Ran Out of Food in the Last Year: Not on file  Transportation Needs:   . Lack of Transportation (Medical): Not on file  . Lack of Transportation (Non-Medical): Not on file  Physical Activity:   . Days of Exercise per Week: Not on file  . Minutes of Exercise per Session: Not on file  Stress:   . Feeling of Stress : Not on file  Social Connections:   . Frequency of Communication with Friends  and Family: Not on file  . Frequency of Social Gatherings with Friends and Family: Not on file  . Attends Religious Services: Not on file  . Active Member of Clubs or Organizations: Not on file  . Attends Archivist Meetings: Not on file  . Marital Status: Not on file  Intimate Partner Violence:   . Fear of Current or Ex-Partner: Not on file  . Emotionally Abused: Not on file  . Physically Abused: Not on file  . Sexually Abused: Not on file    Outpatient Medications Prior to Visit  Medication Sig Dispense Refill  . buPROPion (WELLBUTRIN XL) 150 MG 24 hr tablet Take 1 tablet (150 mg total) by mouth daily. 90 tablet 1  . sertraline (ZOLOFT) 100 MG tablet Daily 90 tablet 1   No facility-administered medications prior to visit.      ROS:  Review of Systems  Constitutional: Negative for fatigue, fever and unexpected weight change.  Respiratory:  Negative for cough, shortness of breath and wheezing.   Cardiovascular: Negative for chest pain, palpitations and leg swelling.  Gastrointestinal: Negative for blood in stool, constipation, diarrhea, nausea and vomiting.  Endocrine: Negative for cold intolerance, heat intolerance and polyuria.  Genitourinary: Negative for dyspareunia, dysuria, flank pain, frequency, genital sores, hematuria, menstrual problem, pelvic pain, urgency, vaginal bleeding, vaginal discharge and vaginal pain.  Musculoskeletal: Negative for back pain, joint swelling and myalgias.  Skin: Negative for rash.  Neurological: Negative for dizziness, syncope, light-headedness, numbness and headaches.  Hematological: Negative for adenopathy.  Psychiatric/Behavioral: Negative for agitation, confusion, sleep disturbance and suicidal ideas. The patient is not nervous/anxious.   BREAST: No symptoms   OBJECTIVE:   Vitals:  BP 110/80   Ht 5\' 3"  (1.6 m)   Wt 131 lb (59.4 kg)   LMP 03/01/2019 (Exact Date)   BMI 23.21 kg/m   Physical Exam Vitals reviewed.  Constitutional:      Appearance: She is well-developed.  Pulmonary:     Effort: Pulmonary effort is normal.  Musculoskeletal:        General: Normal range of motion.     Cervical back: Normal range of motion.  Skin:    General: Skin is warm and dry.  Neurological:     General: No focal deficit present.     Mental Status: She is alert and oriented to person, place, and time.     Cranial Nerves: No cranial nerve deficit.  Psychiatric:        Mood and Affect: Mood normal.        Behavior: Behavior normal.        Thought Content: Thought content normal.        Judgment: Judgment normal.     Assessment/Plan: Encounter for initial prescription of intrauterine contraceptive device (IUD); IUD options discussed. Pt prefers Paragard. Risks/benefits/pros/cons discussed. RTO with menses for insertion.   Encounter for screening mammogram for malignant neoplasm of breast  - Plan: MM 3D SCREEN BREAST BILATERAL; pt to sched mammo.   Family history of breast cancer - Plan: MM 3D SCREEN BREAST BILATERAL; doesn't qualify for cancer genetic testing.     Return if symptoms worsen or fail to improve.  Hermen Mario B. Areta Terwilliger, PA-C 03/22/2019 9:08 AM

## 2019-03-22 ENCOUNTER — Ambulatory Visit (INDEPENDENT_AMBULATORY_CARE_PROVIDER_SITE_OTHER): Payer: 59 | Admitting: Obstetrics and Gynecology

## 2019-03-22 ENCOUNTER — Encounter: Payer: Self-pay | Admitting: Obstetrics and Gynecology

## 2019-03-22 ENCOUNTER — Other Ambulatory Visit: Payer: Self-pay

## 2019-03-22 VITALS — BP 110/80 | Ht 63.0 in | Wt 131.0 lb

## 2019-03-22 DIAGNOSIS — Z3009 Encounter for other general counseling and advice on contraception: Secondary | ICD-10-CM | POA: Diagnosis not present

## 2019-03-22 DIAGNOSIS — Z30014 Encounter for initial prescription of intrauterine contraceptive device: Secondary | ICD-10-CM | POA: Diagnosis not present

## 2019-03-22 DIAGNOSIS — Z1231 Encounter for screening mammogram for malignant neoplasm of breast: Secondary | ICD-10-CM | POA: Diagnosis not present

## 2019-03-22 DIAGNOSIS — Z803 Family history of malignant neoplasm of breast: Secondary | ICD-10-CM | POA: Insufficient documentation

## 2019-03-22 NOTE — Patient Instructions (Signed)
I value your feedback and entrusting us with your care. If you get a [place] patient survey, I would appreciate you taking the time to let us know about your experience today. Thank you!  As of January 07, 2019, your lab results will be released to your MyChart immediately, before I even have a chance to see them. Please give me time to review them and contact you if there are any abnormalities. Thank you for your patience.  

## 2019-03-26 ENCOUNTER — Telehealth: Payer: Self-pay | Admitting: Obstetrics and Gynecology

## 2019-03-26 NOTE — Telephone Encounter (Signed)
Patient is schedule for 03/30/19 at 3:30 with ABC for Paraguard placement

## 2019-03-26 NOTE — Telephone Encounter (Signed)
Noted. Paragard reserved for this patient. 

## 2019-03-27 ENCOUNTER — Ambulatory Visit: Payer: 59 | Attending: Internal Medicine

## 2019-03-27 DIAGNOSIS — Z23 Encounter for immunization: Secondary | ICD-10-CM | POA: Insufficient documentation

## 2019-03-27 NOTE — Progress Notes (Signed)
   Covid-19 Vaccination Clinic  Name:  Gwendolyn Hull    MRN: [id number] DOB: 1975/10/14  03/27/2019  [name] was observed post Covid-19 immunization for 15 minutes without incidence. She was provided with Vaccine Information Sheet and instruction to access the V-Safe system.   [name] was instructed to call 911 with any severe reactions post vaccine: Marland Kitchen Difficulty breathing  . Swelling of your face and throat  . A fast heartbeat  . A bad rash all over your body  . Dizziness and weakness    Immunizations Administered    Name Date Dose VIS Date Route   Moderna COVID-19 Vaccine 03/27/2019  9:52 AM 0.5 mL 12/29/2018 Intramuscular   Manufacturer: Moderna   Lot: [id number]   [id number]

## 2019-03-30 ENCOUNTER — Ambulatory Visit (INDEPENDENT_AMBULATORY_CARE_PROVIDER_SITE_OTHER): Payer: 59 | Admitting: Obstetrics and Gynecology

## 2019-03-30 ENCOUNTER — Other Ambulatory Visit: Payer: Self-pay

## 2019-03-30 ENCOUNTER — Encounter: Payer: Self-pay | Admitting: Obstetrics and Gynecology

## 2019-03-30 VITALS — BP 116/80 | Ht 63.0 in | Wt 130.0 lb

## 2019-03-30 DIAGNOSIS — Z30430 Encounter for insertion of intrauterine contraceptive device: Secondary | ICD-10-CM | POA: Diagnosis not present

## 2019-03-30 DIAGNOSIS — Z538 Procedure and treatment not carried out for other reasons: Secondary | ICD-10-CM

## 2019-03-30 MED ORDER — MISOPROSTOL 100 MCG PO TABS: 100.0000 ug | ORAL_TABLET | Freq: Once | ORAL | 0 refills | Status: DC

## 2019-03-30 NOTE — Patient Instructions (Signed)
I value your feedback and entrusting us with your care. If you get a [place] patient survey, I would appreciate you taking the time to let us know about your experience today. Thank you!  As of January 07, 2019, your lab results will be released to your MyChart immediately, before I even have a chance to see them. Please give me time to review them and contact you if there are any abnormalities. Thank you for your patience.   [facility] [phone]  Instructions after IUD insertion  Most women experience no significant problems after insertion of an IUD, however minor cramping and spotting for a few days is common. Cramps may be treated with ibuprofen 800mg every 8 hours or Tylenol 650 mg every 4 hours. Contact [facility] immediately if you experience any of the following symptoms during the next week: temperature >99.6 degrees, worsening pelvic pain, abdominal pain, fainting, unusually heavy vaginal bleeding, foul vaginal discharge, or if you think you have expelled the IUD.  Nothing inserted in the vagina for 48 hours. You will be scheduled for a follow up visit in approximately four weeks.  You should check monthly to be sure you can feel the IUD strings in the upper vagina. If you are having a monthly period, try to check after each period. If you cannot feel the IUD strings,  contact [facility] immediately so we can do an exam to determine if the IUD has been expelled.   Please use backup protection until we can confirm the IUD is in place.  Call [facility] if you are exposed to or diagnosed with a sexually transmitted infection, as we will need to discuss whether it is safe for you to continue using an IUD.   

## 2019-03-30 NOTE — Progress Notes (Signed)
   Chief Complaint  Patient presents with  . Contraception    Paragard insertion     IUD PROCEDURE NOTE:  Gwendolyn Hull is a 44 y.o. [phone] here for Paragard  IUD insertion for [facility].    BP 116/80   Ht 5\' 3"  (1.6 m)   Wt 130 lb (59 kg)   LMP 03/26/2019 (Exact Date)   BMI 23.03 kg/m   IUD Insertion Procedure Note Patient identified, informed consent performed, consent signed.   Discussed risks of irregular bleeding, cramping, infection, malpositioning or misplacement of the IUD outside the uterus which may require further procedure such as laparoscopy, risk of failure <1%. Time out was performed.   Speculum placed in the vagina.  Cervix visualized.  Cleaned with Betadine x 2.  Grasped anteriorly with a single tooth tenaculum.  Unable to sound uterus; couldn't get sound through internal cx os.   ASSESSMENT:  Unsuccessful attempt to insert intrauterine device (IUD) - Plan: misoprostol (CYTOTEC) 100 MCG tablet  Rx cytotec, RTO in 2 days for insertion.   Meds ordered this encounter  Medications  . misoprostol (CYTOTEC) 100 MCG tablet    Sig: Take 1 tablet (100 mcg total) by mouth once for 1 dose. 1 hour before appt    Dispense:  1 tablet    Refill:  0    Order Specific Question:   Supervising Provider    Answer:   Gae Dry [id number]     Return in about 2 days (around 04/01/2019) for Paragard insertion.  Alicia B. Copland, PA-C 03/30/2019 4:03 PM

## 2019-03-31 NOTE — Telephone Encounter (Signed)
Failed insertion (unable to sound past cervical os). Pt is r/s for 04/01/2019. Device remains reserved.

## 2019-04-01 ENCOUNTER — Ambulatory Visit (INDEPENDENT_AMBULATORY_CARE_PROVIDER_SITE_OTHER): Payer: 59 | Admitting: Obstetrics and Gynecology

## 2019-04-01 ENCOUNTER — Encounter: Payer: Self-pay | Admitting: Obstetrics and Gynecology

## 2019-04-01 ENCOUNTER — Other Ambulatory Visit: Payer: Self-pay

## 2019-04-01 DIAGNOSIS — Z30430 Encounter for insertion of intrauterine contraceptive device: Secondary | ICD-10-CM | POA: Diagnosis not present

## 2019-04-01 DIAGNOSIS — Z538 Procedure and treatment not carried out for other reasons: Secondary | ICD-10-CM

## 2019-04-01 MED ORDER — MISOPROSTOL 100 MCG PO TABS: 100.0000 ug | ORAL_TABLET | Freq: Once | ORAL | 0 refills | Status: DC

## 2019-04-01 NOTE — Progress Notes (Signed)
Paragard IUD insertion attempted and failed again after cytotec use this time, but still can't penetrate internal cx os, even with dilators. Attempted insertion 03/30/19 while pt was day 5 of cycle, but no longer bleeding. Pt denies cx tx in past, including LEEP/crytox.  Suggested try again with bleeding with menses and cytotec use night before. RTO with MD for insertion. Pt understands.

## 2019-04-01 NOTE — Patient Instructions (Signed)
I value your feedback and entrusting us with your care. If you get a [facility] patient survey, I would appreciate you taking the time to let us know about your experience today. Thank you!  As of January 07, 2019, your lab results will be released to your MyChart immediately, before I even have a chance to see them. Please give me time to review them and contact you if there are any abnormalities. Thank you for your patience.   [facility] [phone]  Instructions after IUD insertion  Most women experience no significant problems after insertion of an IUD, however minor cramping and spotting for a few days is common. Cramps may be treated with ibuprofen 800mg every 8 hours or Tylenol 650 mg every 4 hours. Contact [facility] immediately if you experience any of the following symptoms during the next week: temperature >99.6 degrees, worsening pelvic pain, abdominal pain, fainting, unusually heavy vaginal bleeding, foul vaginal discharge, or if you think you have expelled the IUD.  Nothing inserted in the vagina for 48 hours. You will be scheduled for a follow up visit in approximately four weeks.  You should check monthly to be sure you can feel the IUD strings in the upper vagina. If you are having a monthly period, try to check after each period. If you cannot feel the IUD strings,  contact [facility] immediately so we can do an exam to determine if the IUD has been expelled.   Please use backup protection until we can confirm the IUD is in place.  Call [facility] if you are exposed to or diagnosed with a sexually transmitted infection, as we will need to discuss whether it is safe for you to continue using an IUD.   

## 2019-04-24 ENCOUNTER — Ambulatory Visit: Payer: 59 | Attending: Internal Medicine

## 2019-04-24 DIAGNOSIS — Z23 Encounter for immunization: Secondary | ICD-10-CM

## 2019-04-24 NOTE — Progress Notes (Signed)
   Covid-19 Vaccination Clinic  Name:  Gwendolyn Hull    MRN: [id number] DOB: 1975-04-02  04/24/2019  Gwendolyn Hull was observed post Covid-19 immunization for 15 minutes without incident. She was provided with Vaccine Information Sheet and instruction to access the V-Safe system.   Gwendolyn Hull was instructed to call 911 with any severe reactions post vaccine: Marland Kitchen Difficulty breathing  . Swelling of face and throat  . A fast heartbeat  . A bad rash all over body  . Dizziness and weakness

## 2019-04-27 ENCOUNTER — Ambulatory Visit: Payer: Self-pay

## 2019-05-06 NOTE — Telephone Encounter (Signed)
Patient is schedule for 05/12/19 with ABC

## 2019-05-10 ENCOUNTER — Encounter: Payer: Self-pay | Admitting: Obstetrics and Gynecology

## 2019-05-10 NOTE — Telephone Encounter (Signed)
Called and spoke with patient about rescheduling to MD per ABC. Patient is schedule for 05/11/19 at 9:50 with AMS for placement

## 2019-05-10 NOTE — Telephone Encounter (Signed)
I want pt to see MD anyway. Can she come in tomorrow with Gwendolyn Hull at 9:50 opening?

## 2019-05-10 NOTE — Telephone Encounter (Signed)
Patient is schedule for 05/11/19 with AMS

## 2019-05-11 ENCOUNTER — Ambulatory Visit (INDEPENDENT_AMBULATORY_CARE_PROVIDER_SITE_OTHER): Payer: 59 | Admitting: Family Medicine

## 2019-05-11 ENCOUNTER — Encounter: Payer: Self-pay | Admitting: Obstetrics and Gynecology

## 2019-05-11 ENCOUNTER — Other Ambulatory Visit: Payer: Self-pay

## 2019-05-11 ENCOUNTER — Ambulatory Visit (INDEPENDENT_AMBULATORY_CARE_PROVIDER_SITE_OTHER): Payer: 59 | Admitting: Obstetrics and Gynecology

## 2019-05-11 ENCOUNTER — Encounter: Payer: Self-pay | Admitting: Family Medicine

## 2019-05-11 VITALS — BP 118/76 | HR 85 | Ht 63.0 in | Wt 129.0 lb

## 2019-05-11 VITALS — BP 110/70 | HR 96 | Temp 97.3°F | Resp 16 | Ht 63.0 in | Wt 128.9 lb

## 2019-05-11 DIAGNOSIS — Z1159 Encounter for screening for other viral diseases: Secondary | ICD-10-CM

## 2019-05-11 DIAGNOSIS — Z130 Encounter for screening for diseases of the blood and blood-forming organs and certain disorders involving the immune mechanism: Secondary | ICD-10-CM | POA: Diagnosis not present

## 2019-05-11 DIAGNOSIS — Z131 Encounter for screening for diabetes mellitus: Secondary | ICD-10-CM

## 2019-05-11 DIAGNOSIS — Z13220 Encounter for screening for lipoid disorders: Secondary | ICD-10-CM

## 2019-05-11 DIAGNOSIS — Z0000 Encounter for general adult medical examination without abnormal findings: Secondary | ICD-10-CM | POA: Diagnosis not present

## 2019-05-11 DIAGNOSIS — Z30430 Encounter for insertion of intrauterine contraceptive device: Secondary | ICD-10-CM

## 2019-05-11 DIAGNOSIS — Z113 Encounter for screening for infections with a predominantly sexual mode of transmission: Secondary | ICD-10-CM

## 2019-05-11 NOTE — Telephone Encounter (Signed)
Noted. Paragard reserved for this patient. 

## 2019-05-11 NOTE — Patient Instructions (Addendum)
Contact insurance:  About colon cancer screening, if covered ( colonoscopy or cologuard?) and where Ask about HPV vaccine if it is covered?   Preventive Care 81-46 Years Old, Female Preventive care refers to visits with your health care provider and lifestyle choices that can promote health and wellness. This includes:  A yearly physical exam. This may also be called an annual well check.  Regular dental visits and eye exams.  Immunizations.  Screening for certain conditions.  Healthy lifestyle choices, such as eating a healthy diet, getting regular exercise, not using drugs or products that contain nicotine and tobacco, and limiting alcohol use. What can I expect for my preventive care visit? Physical exam Your health care provider will check your:  Height and weight. This may be used to calculate body mass index (BMI), which tells if you are at a healthy weight.  Heart rate and blood pressure.  Skin for abnormal spots. Counseling Your health care provider may ask you questions about your:  Alcohol, tobacco, and drug use.  Emotional well-being.  Home and relationship well-being.  Sexual activity.  Eating habits.  Work and work Statistician.  Method of birth control.  Menstrual cycle.  Pregnancy history. What immunizations do I need?  Influenza (flu) vaccine  This is recommended every year. Tetanus, diphtheria, and pertussis (Tdap) vaccine  You may need a Td booster every 10 years. Varicella (chickenpox) vaccine  You may need this if you have not been vaccinated. Zoster (shingles) vaccine  You may need this after age 47. Measles, mumps, and rubella (MMR) vaccine  You may need at least one dose of MMR if you were born in 1957 or later. You may also need a second dose. Pneumococcal conjugate (PCV13) vaccine  You may need this if you have certain conditions and were not previously vaccinated. Pneumococcal polysaccharide (PPSV23) vaccine  You may need one  or two doses if you smoke cigarettes or if you have certain conditions. Meningococcal conjugate (MenACWY) vaccine  You may need this if you have certain conditions. Hepatitis A vaccine  You may need this if you have certain conditions or if you travel or work in places where you may be exposed to hepatitis A. Hepatitis B vaccine  You may need this if you have certain conditions or if you travel or work in places where you may be exposed to hepatitis B. Haemophilus influenzae type b (Hib) vaccine  You may need this if you have certain conditions. Human papillomavirus (HPV) vaccine  If recommended by your health care provider, you may need three doses over 6 months. You may receive vaccines as individual doses or as more than one vaccine together in one shot (combination vaccines). Talk with your health care provider about the risks and benefits of combination vaccines. What tests do I need? Blood tests  Lipid and cholesterol levels. These may be checked every 5 years, or more frequently if you are over 38 years old.  Hepatitis C test.  Hepatitis B test. Screening  Lung cancer screening. You may have this screening every year starting at age 56 if you have a 30-pack-year history of smoking and currently smoke or have quit within the past 15 years.  Colorectal cancer screening. All adults should have this screening starting at age 29 and continuing until age 64. Your health care provider may recommend screening at age 17 if you are at increased risk. You will have tests every 1-10 years, depending on your results and the type of screening test.  Diabetes screening. This is done by checking your blood sugar (glucose) after you have not eaten for a while (fasting). You may have this done every 1-3 years.  Mammogram. This may be done every 1-2 years. Talk with your health care provider about when you should start having regular mammograms. This may depend on whether you have a family  history of breast cancer.  BRCA-related cancer screening. This may be done if you have a family history of breast, ovarian, tubal, or peritoneal cancers.  Pelvic exam and Pap test. This may be done every 3 years starting at age 28. Starting at age 79, this may be done every 5 years if you have a Pap test in combination with an HPV test. Other tests  Sexually transmitted disease (STD) testing.  Bone density scan. This is done to screen for osteoporosis. You may have this scan if you are at high risk for osteoporosis. Follow these instructions at home: Eating and drinking  Eat a diet that includes fresh fruits and vegetables, whole grains, lean protein, and low-fat dairy.  Take vitamin and mineral supplements as recommended by your health care provider.  Do not drink alcohol if: ? Your health care provider tells you not to drink. ? You are pregnant, may be pregnant, or are planning to become pregnant.  If you drink alcohol: ? Limit how much you have to 0-1 drink a day. ? Be aware of how much alcohol is in your drink. In the [place] one drink equals one 12 oz bottle of beer (355 mL), one 5 oz glass of wine (148 mL), or one 1 oz glass of hard liquor (44 mL). Lifestyle  Take daily care of your teeth and gums.  Stay active. Exercise for at least 30 minutes on 5 or more days each week.  Do not use any products that contain nicotine or tobacco, such as cigarettes, e-cigarettes, and chewing tobacco. If you need help quitting, ask your health care provider.  If you are sexually active, practice safe sex. Use a condom or other form of birth control (contraception) in order to prevent pregnancy and STIs (sexually transmitted infections).  If told by your health care provider, take low-dose aspirin daily starting at age 51. What's next?  Visit your health care provider once a year for a well check visit.  Ask your health care provider how often you should have your eyes and teeth  checked.  Stay up to date on all vaccines. This information is not intended to replace advice given to you by your health care provider. Make sure you discuss any questions you have with your health care provider. Document Revised: 09/25/2017 Document Reviewed: 09/25/2017 Elsevier Patient Education  2020 [employer].

## 2019-05-11 NOTE — Progress Notes (Signed)
Name: Gwendolyn Hull   MRN: [id number]    DOB: [birth year]   Date:05/11/2019       Progress Note  Subjective  Chief Complaint  Chief Complaint  Patient presents with  . Annual Exam    HPI  Patient presents for annual CPE.  Diet: balanced diet Exercise: she is getting enough exercise   USPSTF grade A and B recommendations    Office Visit from 05/11/2019 in [facility]  AUDIT-C Score  1     Depression: Phq 9 is  negative Depression screen [facility] 2/9 05/11/2019 03/12/2019 09/10/2018 09/10/2018 05/25/2018  Decreased Interest 0 0 0 0 0  Down, Depressed, Hopeless 0 0 0 0 0  PHQ - 2 Score 0 0 0 0 0  Altered sleeping 0 0 1 0 0  Tired, decreased energy 0 0 0 0 0  Change in appetite 0 0 0 0 0  Feeling bad or failure about yourself  0 0 1 0 0  Trouble concentrating 0 0 0 0 0  Moving slowly or fidgety/restless 0 0 0 0 0  Suicidal thoughts 0 0 0 0 0  PHQ-9 Score 0 0 2 0 0  Difficult doing work/chores Not difficult at all Not difficult at all Not difficult at all - Not difficult at all  Some recent data might be hidden   Hypertension: BP Readings from Last 3 Encounters:  05/11/19 110/70  04/01/19 110/80  03/30/19 116/80   Obesity: Wt Readings from Last 3 Encounters:  05/11/19 128 lb 14.4 oz (58.5 kg)  04/01/19 130 lb (59 kg)  03/30/19 130 lb (59 kg)   BMI Readings from Last 3 Encounters:  05/11/19 22.83 kg/m  04/01/19 23.03 kg/m  03/30/19 23.03 kg/m     Hep C Screening: today  STD testing and prevention (HIV/chl/gon/syphilis): today, new sexual partner  Intimate partner violence: negative  Sexual History (Partners/Practices/Protection from [employer] hx STI/Pregnancy Plans): no problems  Pain during Intercourse: none  Menstrual History/LMP/Abnormal Bleeding:  Regular cycles, getting ready to get an IUD  Incontinence Symptoms: very seldom stress incontinence   Breast cancer:  - Last Mammogram: she will schedule now  - BRCA gene screening: mother had  breast cancer   Osteoporosis: Discussed high calcium and vitamin D supplementation, weight bearing exercises  Cervical cancer screening: repeat in 2022, sees gyn   Skin cancer: Discussed monitoring for atypical lesions  Colorectal cancer: discussed USPTF, she will let me know what she decides Lung cancer:   Low Dose CT Chest recommended if Age 46-80 years, 30 pack-year currently smoking OR have quit w/in 15years. Patient does not qualify.    Advanced Care Planning: A voluntary discussion about advance care planning including the explanation and discussion of advance directives.  Discussed health care proxy and Living will, and the patient was able to identify a health care proxy as her husband/but she will update it soon.   Patient does  have a living will at present time.   Lipids: Lab Results  Component Value Date   CHOL 232 (H) 10/07/2017   Lab Results  Component Value Date   HDL 63 10/07/2017   Lab Results  Component Value Date   LDLCALC 146 (H) 10/07/2017   Lab Results  Component Value Date   TRIG 115 10/07/2017   Lab Results  Component Value Date   CHOLHDL 3.7 10/07/2017   No results found for: LDLDIRECT  Glucose: Glucose, Bld  Date Value Ref Range Status  10/07/2017 87 65 - 99  mg/dL Final    Comment:    .            Fasting reference interval .     Patient Active Problem List   Diagnosis Date Noted  . Family history of breast cancer 03/22/2019  . Major depression, recurrent, chronic ([place]) 04/04/2017  . Anxiety 04/04/2017  . Seasonal allergic rhinitis 04/04/2017    Past Surgical History:  Procedure Laterality Date  . CESAREAN SECTION  01/1998, 10/1999, 04/2002    Family History  Problem Relation Age of Onset  . Breast cancer Mother 6  . Cancer Mother        breast cancer 15 (age 65), tongue cancer 2011  . Diabetes Mother   . Colon cancer Maternal Grandmother   . Depression Brother   . Diabetes Father   . Hearing loss Father   . Heart disease  Maternal Grandfather   . Cancer Paternal Grandfather   . Diabetes Paternal Grandfather     Social History   Socioeconomic History  . Marital status: Legally Separated    Spouse name: Rodman Key  . Number of children: 3  . Years of education: Not on file  . Highest education level: Bachelor's degree (e.g., BA, AB, BS)  Occupational History  . Occupation: Pharmacist, hospital   Tobacco Use  . Smoking status: Never Smoker  . Smokeless tobacco: Never Used  Substance and Sexual Activity  . Alcohol use: Yes    Comment: occasional drinks, no more than 3 in an evening  . Drug use: No  . Sexual activity: Yes    Partners: Male    Birth control/protection: Condom  Other Topics Concern  . 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    Two older sons in college in [place] Strain:   . Difficulty of Paying Living Expenses:   Food Insecurity:   . Worried About Charity fundraiser in the Last Year:   . Arboriculturist in the Last Year:   Transportation Needs: No Transportation Needs  . Lack of Transportation (Medical): No  . Lack of Transportation (Non-Medical): No  Physical Activity: Sufficiently Active  . Days of Exercise per Week: 5 days  . Minutes of Exercise per Session: 30 min  Stress:   . Feeling of Stress :   Social Connections: Slightly Isolated  . Frequency of Communication with Friends and Family: More than three times a week  . Frequency of Social Gatherings with Friends and Family: More than three times a week  . Attends Religious Services: More than 4 times per year  . Active Member of Clubs or Organizations: Yes  . Attends Theatre manager Meetings: More than 4 times per year  . Marital Status: Separated  Intimate Partner Violence: Not At Risk  . Fear of Current or Ex-Partner: No  . Emotionally Abused: No  . Physically Abused: No  . Sexually Abused: No     Current Outpatient Medications:  .  buPROPion (WELLBUTRIN XL) 150 MG 24 hr tablet, Take 1 tablet (150 mg total) by mouth  daily., Disp: 90 tablet, Rfl: 1 .  sertraline (ZOLOFT) 100 MG tablet, Daily, Disp: 90 tablet, Rfl: 1 .  misoprostol (CYTOTEC) 100 MCG tablet, Take 1 tablet (100 mcg total) by mouth once for 1 dose. night before appt, Disp: 1 tablet, Rfl: 0  No Known Allergies   ROS  Constitutional: Negative for fever or weight change.  Respiratory: Negative for cough and shortness of breath.   Cardiovascular: Negative for chest pain or palpitations.  Gastrointestinal: Negative for abdominal pain, no bowel changes.  Musculoskeletal: Negative for gait problem or joint swelling.  Skin: Negative for rash.  Neurological: Negative for dizziness or headache.  No other specific complaints in a complete review of systems (except as listed in HPI above).  Objective  Vitals:   05/11/19 0758  BP: 110/70  Pulse: 96  Resp: 16  Temp: (!) 97.3 F (36.3 C)  TempSrc: Temporal  SpO2: 98%  Weight: 128 lb 14.4 oz (58.5 kg)  Height: _0  (1.6 m)    Body mass index is 22.83 kg/m.  Physical Exam  Constitutional: Patient appears well-developed and well-nourished. No distress.  HENT: Head: Normocephalic and atraumatic. Ears: B TMs ok, no erythema or effusion; Nose: Nose normal. Mouth/Throat: Oropharynx is clear and moist. No oropharyngeal exudate.  Eyes: Conjunctivae and EOM are normal. Pupils are equal, round, and reactive to light. No scleral icterus.  Neck: Normal range of motion. Neck supple. No JVD present. No thyromegaly present.  Cardiovascular: Normal rate, regular rhythm and normal heart sounds.  No murmur heard. No BLE edema. Pulmonary/Chest:  Effort normal and breath sounds normal. No respiratory distress. Abdominal: Soft. Bowel sounds are normal, no distension. There is no tenderness. no masses Breast: no lumps or masses, no nipple discharge or rashes FEMALE GENITALIA:  External genitalia normal External urethra normal Vaginal vault normal without discharge or lesions Cervix normal without discharge or lesions Bimanual exam normal without masses RECTAL: not done Musculoskeletal: Normal range of motion, no joint effusions. No gross deformities Neurological: he is alert and oriented to person, place, and time. No cranial nerve deficit. Coordination, balance, strength, speech and gait are normal.  Skin: Skin is warm and dry. No rash noted. No erythema.  Psychiatric: Patient has a normal mood and affect. behavior is normal. Judgment and thought content normal.  Fall Risk: Fall Risk  05/11/2019 03/12/2019 09/10/2018 05/25/2018 04/21/2017  Falls in the past year? 0 0 0 0 No  Number falls in past yr: 0 0 0 0 -  Injury with Fall? 0 0 0 0 -  Follow up Falls evaluation completed Falls evaluation completed - Falls evaluation completed -     Assessment & Plan  1. Well adult exam  - COMPLETE METABOLIC PANEL WITH GFR - CBC with Differential/Platelet - Lipid panel - Hemoglobin A1c  2. Screening for deficiency anemia  - CBC with Differential/Platelet  3. Lipid screening  - Lipid panel  4. Diabetes mellitus screening  - Hemoglobin A1c  5. Routine screening for STI (sexually transmitted infection)  - RPR - HIV Antibody (routine testing w rflx)  6. Need for hepatitis C screening test  - Hepatitis C antibody  -USPSTF grade A and B recommendations reviewed with patient; age-appropriate recommendations, preventive care, screening tests, etc discussed and encouraged; healthy living encouraged; see AVS for patient education given to patient -Discussed importance of 150 minutes of physical activity weekly, eat two servings of  fish weekly, eat one serving of tree nuts ( cashews, pistachios, pecans, almonds.Marland Kitchen) every other day,  eat 6 servings of fruit/vegetables daily and drink plenty of water and avoid sweet beverages.

## 2019-05-11 NOTE — Progress Notes (Signed)
   GYNECOLOGY OFFICE PROCEDURE NOTE  Gwendolyn Hull is a 44 y.o. [phone] here for a ParaGard IUD insertion. No GYN concerns.  Last pap smear was on 10/09/2017 and was normal.   Patient's last menstrual period was 05/06/2019 (exact date)..  The indication for her IUD is contraception control.  IUD Insertion Procedure Note Patient identified, informed consent performed, consent signed.   Discussed risks of irregular bleeding, cramping, infection, malpositioning, expulsion or uterine perforation of the IUD (1:1000 placements)  which may require further procedure such as laparoscopy.  IUD while effective at preventing pregnancy do not prevent transmission of sexually transmitted diseases and use of barrier methods for this purpose was discussed. Time out was performed.  Urine pregnancy test negative.  Speculum placed in the vagina.  Cervix visualized.  Cleaned with Betadine x 2.  Grasped anteriorly with a single tooth tenaculum.  Uterus sounded to 8.5 cm. IUD placed per manufacturer's recommendations.  Strings trimmed to 3 cm. Tenaculum was removed, good hemostasis noted.  Patient tolerated procedure well.   Patient was given post-procedure instructions.  She was advised to have backup contraception for one week.  Patient was also asked to check IUD strings periodically and follow up in 6 weeks for IUD check.  Malachy Mood, MD, [facility], [place]

## 2019-05-12 ENCOUNTER — Ambulatory Visit: Payer: 59 | Admitting: Obstetrics and Gynecology

## 2019-05-12 LAB — COMPLETE METABOLIC PANEL WITH GFR
AG Ratio: 1.9 (calc) (ref 1.0–2.5)
ALT: 9 U/L (ref 6–29)
AST: 11 U/L (ref 10–35)
Albumin: 4.4 g/dL (ref 3.6–5.1)
Alkaline phosphatase (APISO): 85 U/L (ref 31–125)
BUN: 13 mg/dL (ref 7–25)
CO2: 30 mmol/L (ref 20–32)
Calcium: 10.1 mg/dL (ref 8.6–10.2)
Chloride: 103 mmol/L (ref 98–110)
Creat: 0.6 mg/dL (ref 0.50–1.10)
GFR, Est African American: 128 mL/min/{1.73_m2} (ref >=60)
GFR, Est Non African American: 110 mL/min/{1.73_m2} (ref >=60)
Globulin: 2.3 g/dL (calc) (ref 1.9–3.7)
Glucose, Bld: 92 mg/dL (ref 65–99)
Potassium: 4.5 mmol/L (ref 3.5–5.3)
Sodium: 140 mmol/L (ref 135–146)
Total Bilirubin: 0.5 mg/dL (ref 0.2–1.2)
Total Protein: 6.7 g/dL (ref 6.1–8.1)

## 2019-05-12 LAB — CBC WITH DIFFERENTIAL/PLATELET
Absolute Monocytes: 512 cells/uL (ref 200–950)
Basophils Absolute: 31 cells/uL (ref 0–200)
Basophils Relative: 0.5 %
Eosinophils Absolute: 61 cells/uL (ref 15–500)
Eosinophils Relative: 1 %
HCT: 41.6 % (ref 35.0–45.0)
Hemoglobin: 13.9 g/dL (ref 11.7–15.5)
Lymphs Abs: 1830 cells/uL (ref 850–3900)
MCH: 31.8 pg (ref 27.0–33.0)
MCHC: 33.4 g/dL (ref 32.0–36.0)
MCV: 95.2 fL (ref 80.0–100.0)
MPV: 9.7 fL (ref 7.5–12.5)
Monocytes Relative: 8.4 %
Neutro Abs: 3666 cells/uL (ref 1500–7800)
Neutrophils Relative %: 60.1 %
Platelets: 281 10*3/uL (ref 140–400)
RBC: 4.37 10*6/uL (ref 3.80–5.10)
RDW: 11.2 % (ref 11.0–15.0)
Total Lymphocyte: 30 %
WBC: 6.1 10*3/uL (ref 3.8–10.8)

## 2019-05-12 LAB — HEMOGLOBIN A1C
Hgb A1c MFr Bld: 5.3 % of total Hgb (ref <5.7)
Mean Plasma Glucose: 105 (calc)
eAG (mmol/L): 5.8 (calc)

## 2019-05-12 LAB — LIPID PANEL
Cholesterol: 237 mg/dL — ABNORMAL HIGH (ref <200)
HDL: 66 mg/dL (ref >=50)
LDL Cholesterol (Calc): 143 mg/dL (calc) — ABNORMAL HIGH
Non-HDL Cholesterol (Calc): 171 mg/dL (calc) — ABNORMAL HIGH (ref <130)
Total CHOL/HDL Ratio: 3.6 (calc) (ref <5.0)
Triglycerides: 151 mg/dL — ABNORMAL HIGH (ref <150)

## 2019-05-12 LAB — HEPATITIS C ANTIBODY
Hepatitis C Ab: NONREACTIVE
SIGNAL TO CUT-OFF: 0.01 (ref <1.00)

## 2019-05-12 LAB — RPR: RPR Ser Ql: NONREACTIVE

## 2019-05-12 LAB — HIV ANTIBODY (ROUTINE TESTING W REFLEX): HIV 1&2 Ab, 4th Generation: NONREACTIVE

## 2019-06-25 ENCOUNTER — Ambulatory Visit
Admission: RE | Admit: 2019-06-25 | Discharge: 2019-06-25 | Disposition: A | Discharge status: Home / Self Care | Payer: 59 | Source: Ambulatory Visit | Attending: Obstetrics and Gynecology | Admitting: Obstetrics and Gynecology

## 2019-06-25 DIAGNOSIS — Z803 Family history of malignant neoplasm of breast: Secondary | ICD-10-CM | POA: Insufficient documentation

## 2019-06-25 DIAGNOSIS — Z1231 Encounter for screening mammogram for malignant neoplasm of breast: Secondary | ICD-10-CM | POA: Insufficient documentation

## 2019-06-27 ENCOUNTER — Encounter: Payer: Self-pay | Admitting: Obstetrics and Gynecology

## 2019-07-09 ENCOUNTER — Other Ambulatory Visit: Payer: Self-pay

## 2019-07-09 ENCOUNTER — Encounter: Payer: Self-pay | Admitting: Obstetrics and Gynecology

## 2019-07-09 ENCOUNTER — Ambulatory Visit (INDEPENDENT_AMBULATORY_CARE_PROVIDER_SITE_OTHER): Payer: 59 | Admitting: Obstetrics and Gynecology

## 2019-07-09 VITALS — BP 106/68 | HR 82 | Ht 63.0 in | Wt 131.0 lb

## 2019-07-09 DIAGNOSIS — Z30431 Encounter for routine checking of intrauterine contraceptive device: Secondary | ICD-10-CM

## 2019-07-09 NOTE — Progress Notes (Signed)
Obstetrics & Gynecology Office Visit   Chief Complaint:  Chief Complaint  Patient presents with  . IUD string check    History of Present Illness: 44 y.o. patient presenting for follow up of Paragard IUD placement 6+ weeks ago.  The indication for her IUD was contraception.  She denies any complications since her IUD placement. Has has two normal menstrual cycles since placement.  is able to feel strings.    Review of Systems: Review of Systems  Constitutional: Negative.   Gastrointestinal: Negative.   Genitourinary: Negative.     Past Medical History:  Past Medical History:  Diagnosis Date  . Allergy 1997   mild, seasonal  . Anxiety fall 1996   concurrent with depression; see dates above  . Depression fall 1996   episodes fall 1996, and postpartum fall 2000 & summer 2002  . GERD (gastroesophageal reflux disease) 2010(?)   occasional, mild, and stress-related    Past Surgical History:  Past Surgical History:  Procedure Laterality Date  . CESAREAN SECTION  01/1998, 10/1999, 04/2002    Gynecologic History: Patient's last menstrual period was 06/24/2019.  Obstetric History: [id number]  Family History:  Family History  Problem Relation Age of Onset  . Breast cancer Mother 77  . Cancer Mother        breast cancer 16 (age 44), tongue cancer 2011  . Diabetes Mother   . Colon cancer Maternal Grandmother   . Depression Brother   . Diabetes Father   . Hearing loss Father   . Heart disease Maternal Grandfather   . Cancer Paternal Grandfather   . Diabetes Paternal Grandfather     Social History:  Social History   Socioeconomic History  . Marital status: Legally Separated    Spouse name: Rodman Key  . Number of children: 3  . Years of education: Not on file  . Highest education level: Bachelor's degree (e.g., BA, AB, BS)  Occupational History  . Occupation: Pharmacist, hospital   Tobacco Use  . Smoking status: Never Smoker  . Smokeless tobacco: Never Used  Vaping Use  .  Vaping Use: Never used  Substance and Sexual Activity  . Alcohol use: Yes    Comment: occasional drinks, no more than 3 in an evening  . Drug use: No  . Sexual activity: Yes    Partners: Male    Birth control/protection: I.U.D.  Other Topics Concern  . 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    Two older sons in college in [place] Strain: [place]   . Difficulty of Paying Living Expenses: Not hard at all  Food Insecurity: No Food Insecurity  . Worried About Charity fundraiser in the Last Year: Never true  . Ran Out of Food in the Last Year: Never true  Transportation Needs: No Transportation Needs  . Lack of Transportation (Medical): No  . Lack of Transportation (Non-Medical): No  Physical Activity: Sufficiently Active  . Days of Exercise per Week: 5 days  . Minutes of Exercise per Session: 30 min  Stress: Stress  Concern Present  . Feeling of Stress : To some extent  Social Connections: Moderately Integrated  . Frequency of Communication with Friends and Family: More than three times a week  . Frequency of Social Gatherings with Friends and Family: Three times a week  . Attends Religious Services: 1 to 4 times per year  . Active Member of Clubs or Organizations: Yes  . Attends Archivist Meetings: More than 4 times per year  . Marital Status: Separated  Intimate Partner Violence: Not At Risk  . Fear of Current or Ex-Partner: No  . Emotionally Abused: No  . Physically Abused: No  . Sexually Abused: No    Allergies:  No Known Allergies  Medications: Prior to Admission medications   Medication Sig Start Date End  Date Taking? Authorizing Provider  sertraline (ZOLOFT) 100 MG tablet Daily 03/12/19  Yes Steele Sizer, MD    Physical Exam Blood pressure 106/68, pulse 82, height 5\' 3"  (1.6 m), weight 131 lb (59.4 kg), last menstrual period 06/24/2019. Patient's last menstrual period was 06/24/2019.  General: NAD HEENT: normocephalic, anicteric Pulmonary: No increased work of breathing  Genitourinary:  External: Normal external female genitalia.  Normal urethral meatus, normal  Bartholin's and Skene's glands.    Vagina: Normal vaginal mucosa, no evidence of prolapse.    Cervix: Grossly normal in appearance, no bleeding, IUD strings visualized 3cm  Uterus: Non-enlarged, mobile, normal contour.  No CMT  Adnexa: ovaries non-enlarged, no adnexal masses  Rectal: deferred  Lymphatic: no evidence of inguinal lymphadenopathy Extremities: no edema, erythema, or tenderness Neurologic: Grossly intact Psychiatric: mood appropriate, affect full  Female chaperone present for pelvic and breast  portions of the physical exam  Assessment: 44 y.o. [id number] IUD check Plan: Problem List Items Addressed This Visit    None       1.  The patient was given instructions to check her IUD strings monthly and call with any problems or concerns.  She should call for fevers, chills, abnormal vaginal discharge, pelvic pain, or other complaints.  2.   IUDs while effective at preventing pregnancy do not prevent transmission of sexually transmitted diseases and use of barrier methods for this purpose was discussed.  Low overall incidence of failure with 99.7% efficacy rate in typical use.  The patient has not contraindication to IUD placement.  3.  She will return for a annual exam in 1 year.  All questions answered.  4) A total of 15 minutes were spent in face-to-face contact with the patient during this encounter with over half of that time devoted to counseling and coordination of care.  5) Return in about 1 year  (around 07/08/2020) for annual.   Malachy Mood, MD, [place], [facility] 07/09/2019, 9:24 AM

## 2019-09-09 ENCOUNTER — Ambulatory Visit: Payer: 59 | Admitting: Family Medicine

## 2019-10-19 ENCOUNTER — Other Ambulatory Visit: Payer: Self-pay | Admitting: Family Medicine

## 2019-10-19 DIAGNOSIS — F325 Major depressive disorder, single episode, in full remission: Secondary | ICD-10-CM

## 2019-10-19 DIAGNOSIS — F419 Anxiety disorder, unspecified: Secondary | ICD-10-CM

## 2019-10-19 NOTE — Telephone Encounter (Signed)
Requested medications are due for refill today?  Yes  Requested medications are on active medication list?  Yes  Last Refill:   03/12/2019 # 90 with one refill   Future visit scheduled? Yes in one week.    Notes to Clinic:  Please see below:    sertraline (ZOLOFT) 100 MG tablet [Pharmacy Med Name: SERTRALINE HCL 100 MG TABLET] is not on the preferred formulary for the patient's insurance plan. Below are alternatives which are likely to be more affordable. Do not assume that every medication presented is a clinically appropriate alternative.  These alternatives are medications that are in the same pharmaceutical subclass (Selective Serotonin Reuptake Inhibitors (SSRIs)) as the ordered medication and are on formulary for the patient's insurance plan.

## 2019-10-27 ENCOUNTER — Encounter: Payer: Self-pay | Admitting: Family Medicine

## 2019-10-27 ENCOUNTER — Other Ambulatory Visit: Payer: Self-pay

## 2019-10-27 ENCOUNTER — Ambulatory Visit (INDEPENDENT_AMBULATORY_CARE_PROVIDER_SITE_OTHER): Payer: 59 | Admitting: Family Medicine

## 2019-10-27 VITALS — BP 100/62 | HR 100 | Temp 98.3°F | Resp 16 | Ht 63.0 in | Wt 131.3 lb

## 2019-10-27 DIAGNOSIS — J302 Other seasonal allergic rhinitis: Secondary | ICD-10-CM | POA: Diagnosis not present

## 2019-10-27 DIAGNOSIS — M25521 Pain in right elbow: Secondary | ICD-10-CM

## 2019-10-27 DIAGNOSIS — F419 Anxiety disorder, unspecified: Secondary | ICD-10-CM

## 2019-10-27 DIAGNOSIS — F325 Major depressive disorder, single episode, in full remission: Secondary | ICD-10-CM | POA: Diagnosis not present

## 2019-10-27 DIAGNOSIS — K219 Gastro-esophageal reflux disease without esophagitis: Secondary | ICD-10-CM

## 2019-10-27 MED ORDER — SERTRALINE HCL 50 MG PO TABS: 75.0000 mg | ORAL_TABLET | Freq: Every day | ORAL | 0 refills | Status: DC

## 2019-10-27 NOTE — Addendum Note (Signed)
Addended by: Steele Sizer F on: 10/27/2019 11:59 AM   Modules accepted: Level of Service

## 2019-10-27 NOTE — Progress Notes (Deleted)
Name: Gwendolyn Hull   MRN: [id number]    DOB: Dec 09, 1975   Date:10/27/2019       Progress Note  Subjective  Chief Complaint  Chief Complaint  Patient presents with  . Depression  . Anxiety    HPI  *** Patient Active Problem List   Diagnosis Date Noted  . Family history of breast cancer 03/22/2019  . Major depression, recurrent, chronic ([place]) 04/04/2017  . Anxiety 04/04/2017  . Seasonal allergic rhinitis 04/04/2017    Past Surgical History:  Procedure Laterality Date  . CESAREAN SECTION  01/1998, 10/1999, 04/2002    Family History  Problem Relation Age of Onset  . Breast cancer Mother 65  . Cancer Mother        breast cancer 86 (age 30), tongue cancer 2011  . Diabetes Mother   . Colon cancer Maternal Grandmother   . Depression Brother   . Diabetes Father   . Hearing loss Father   . Heart disease Maternal Grandfather   . Cancer Paternal Grandfather   . Diabetes Paternal Grandfather     Social History   Tobacco Use  . Smoking status: Never Smoker  . Smokeless tobacco: Never Used  Substance Use Topics  . Alcohol use: Yes    Comment: occasional drinks, no more than 3 in an evening     Current Outpatient Medications:  .  sertraline (ZOLOFT) 100 MG tablet, TAKE 1 TABLET BY MOUTH EVERY DAY, Disp: 90 tablet, Rfl: 0  No Known Allergies  I personally reviewed {Reviewed:14835} with the patient/caregiver today.   ROS  ***  Objective  Vitals:   10/27/19 0747  BP: 100/62  Pulse: 100  Resp: 16  Temp: 98.3 F (36.8 C)  TempSrc: Oral  SpO2: 96%  Weight: 131 lb 4.8 oz (59.6 kg)  Height: 5\' 3"  (1.6 m)    Body mass index is 23.26 kg/m.  Physical Exam ***  No results found for this or any previous visit (from the past 2160 hour(s)).  Diabetic Foot Exam: Diabetic Foot Exam - Simple   No data filed     ***  PHQ2/9: Depression screen [facility] 2/9 10/27/2019 05/11/2019 03/12/2019 09/10/2018 09/10/2018  Decreased Interest 0 0 0 0 0  Down, Depressed,  Hopeless 0 0 0 0 0  PHQ - 2 Score 0 0 0 0 0  Altered sleeping 1 0 0 1 0  Tired, decreased energy 0 0 0 0 0  Change in appetite 0 0 0 0 0  Feeling bad or failure about yourself  0 0 0 1 0  Trouble concentrating 0 0 0 0 0  Moving slowly or fidgety/restless 0 0 0 0 0  Suicidal thoughts 0 0 0 0 0  PHQ-9 Score 1 0 0 2 0  Difficult doing work/chores Not difficult at all Not difficult at all Not difficult at all Not difficult at all -  Some recent data might be hidden    phq 9 is {gen pos [id number]} ***  Fall Risk: Fall Risk  10/27/2019 05/11/2019 03/12/2019 09/10/2018 05/25/2018  Falls in the past year? 0 0 0 0 0  Number falls in past yr: 0 0 0 0 0  Injury with Fall? 0 0 0 0 0  Follow up - Falls evaluation completed Falls evaluation completed - Falls evaluation completed   ***   Functional Status Survey: Is the patient deaf or have difficulty hearing?: No Does the patient have difficulty seeing, even when wearing glasses/contacts?: No Does the patient have difficulty  concentrating, remembering, or making decisions?: No Does the patient have difficulty walking or climbing stairs?: No Does the patient have difficulty dressing or bathing?: No Does the patient have difficulty doing errands alone such as visiting a doctor's office or shopping?: No ***   Assessment & Plan  *** There are no diagnoses linked to this encounter.

## 2019-10-27 NOTE — Progress Notes (Deleted)
Name: Gwendolyn Hull   MRN: [id number]    DOB: 1975-07-14   Date:10/27/2019       Progress Note  Subjective  Chief Complaint  Chief Complaint  Patient presents with  . Depression  . Anxiety    HPI  Major Depression: she has a long history of depression.She came in early March 2019 with worsening of symptoms.Phq9 was 12is zero today, sheis on Zoloft and Wellbutrin XL and seems to be working He moved back Nov 2019,she separated from [facility] 08/2018, she told him she was having an affair two years ago, she moved out 08/2018 she moved in with a roommate. 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 Emotionally she is doing well. Younger son moved with her , older two children live in [place]. Divorce is not final yet, but hopefully by the end of the year. 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   Patient Active Problem List   Diagnosis Date Noted  . Family history of breast cancer 03/22/2019  . Major depression, recurrent, chronic ([place]) 04/04/2017  . Anxiety 04/04/2017  . Seasonal allergic rhinitis 04/04/2017    Past Surgical History:  Procedure Laterality Date  . CESAREAN SECTION  01/1998, 10/1999, 04/2002    Family History  Problem Relation Age of Onset  . Breast cancer Mother 9  . Cancer Mother        breast cancer 11 (age 19), tongue cancer 2011  . Diabetes Mother   . Colon cancer Maternal Grandmother   . Depression Brother   . Diabetes Father   . Hearing loss  Father   . Heart disease Maternal Grandfather   . Cancer Paternal Grandfather   . Diabetes Paternal Grandfather     Social History   Tobacco Use  . Smoking status: Never Smoker  . Smokeless tobacco: Never Used  Substance Use Topics  . Alcohol use: Yes    Comment: occasional drinks, no more than 3 in an evening     Current Outpatient Medications:  .  sertraline (ZOLOFT) 50 MG tablet, Take 1.5 tablets (75 mg total) by mouth daily. TAKE 1 TABLET BY MOUTH EVERY DAY, Disp: 135 tablet, Rfl: 0  No Known Allergies  I personally reviewed active problem list, medication list, allergies, family history, social history, health maintenance with the patient/caregiver today.   ROS  Constitutional: Negative for fever or weight change.  Respiratory: Negative for cough and shortness of breath.   Cardiovascular: Negative for chest pain or palpitations.  Gastrointestinal: Negative for abdominal pain, no bowel changes.  Musculoskeletal: Negative for gait problem or joint swelling.  Skin: Negative for rash.  Neurological: Negative for dizziness or headache.  No other specific complaints in a complete review of systems (except as listed in HPI above).  Objective  Vitals:   10/27/19 0747  BP: 100/62  Pulse: 100  Resp: 16  Temp: 98.3 F (36.8 C)  TempSrc: Oral  SpO2:  96%  Weight: 131 lb 4.8 oz (59.6 kg)  Height: 5\' 3"  (1.6 m)    Body mass index is 23.26 kg/m.  Physical Exam  Constitutional: Patient appears well-developed and well-nourished.  No distress.  HEENT: head atraumatic, normocephalic, pupils equal and reactive to light,  neck supple Cardiovascular: Normal rate, regular rhythm and normal heart sounds.  No murmur heard. No BLE edema. Pulmonary/Chest: Effort normal and breath sounds normal. No respiratory distress. Abdominal: Soft.  There is no tenderness. Psychiatric: Patient has a normal mood and affect. behavior is normal. Judgment and thought content  normal.  PHQ2/9: Depression screen [facility] 2/9 10/27/2019 05/11/2019 03/12/2019 09/10/2018 09/10/2018  Decreased Interest 0 0 0 0 0  Down, Depressed, Hopeless 0 0 0 0 0  PHQ - 2 Score 0 0 0 0 0  Altered sleeping 1 0 0 1 0  Tired, decreased energy 0 0 0 0 0  Change in appetite 0 0 0 0 0  Feeling bad or failure about yourself  0 0 0 1 0  Trouble concentrating 0 0 0 0 0  Moving slowly or fidgety/restless 0 0 0 0 0  Suicidal thoughts 0 0 0 0 0  PHQ-9 Score 1 0 0 2 0  Difficult doing work/chores Not difficult at all Not difficult at all Not difficult at all Not difficult at all -  Some recent data might be hidden    phq 9 is negative   Fall Risk: Fall Risk  10/27/2019 05/11/2019 03/12/2019 09/10/2018 05/25/2018  Falls in the past year? 0 0 0 0 0  Number falls in past yr: 0 0 0 0 0  Injury with Fall? 0 0 0 0 0  Follow up - Falls evaluation completed Falls evaluation completed - Falls evaluation completed    Functional Status Survey: Is the patient deaf or have difficulty hearing?: No Does the patient have difficulty seeing, even when wearing glasses/contacts?: No Does the patient have difficulty concentrating, remembering, or making decisions?: No Does the patient have difficulty walking or climbing stairs?: No Does the patient have difficulty dressing or bathing?: No Does the patient have difficulty doing errands alone such as visiting a doctor's office or shopping?: No  Assessment & Plan   1. Major depression in remission (HCC)  - sertraline (ZOLOFT) 50 MG tablet; Take 1.5 tablets (75 mg total) by mouth daily. TAKE 1 TABLET BY MOUTH EVERY DAY  Dispense: 135 tablet; Refill: 0  2. Gastroesophageal reflux disease without esophagitis  Doing well, no problems in a long time  3. Seasonal allergic rhinitis, unspecified trigger  Doing well at this time   4. Anxiety  - sertraline (ZOLOFT) 50 MG tablet; Take 1.5 tablets (75 mg total) by mouth daily. TAKE 1 TABLET BY MOUTH EVERY DAY   Dispense: 135 tablet; Refill: 0   5. Right elbow pain  Advised ice, rest

## 2019-10-27 NOTE — Progress Notes (Signed)
Name: Gwendolyn Hull   MRN: [id number]    DOB: 02-12-1975   Date:10/27/2019       Progress Note  Subjective  Chief Complaint  Chief Complaint  Patient presents with  . Depression  . Anxiety    HPI  Major Depression: she has a long history of depression.She came in early March 2019 with worsening of symptoms.Phq9 was 12is zero today, sheis on Zoloft and Wellbutrin XL and seems to be working He moved back Nov 2019,she separated from [facility] 08/2018, she told him she was having an affair two years ago, she moved out 08/2018 she moved in with a roommate. 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 Emotionally she is doing well. Younger son moved with her , older two children live in [place]. Divorce is not final yet, but hopefully by the end of the year. 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   Patient Active Problem List   Diagnosis Date Noted  . Family history of breast cancer 03/22/2019  . Major depression, recurrent, chronic ([place]) 04/04/2017  . Anxiety 04/04/2017  . Seasonal allergic rhinitis 04/04/2017    Past Surgical History:  Procedure Laterality Date  . CESAREAN SECTION  01/1998, 10/1999, 04/2002    Family History  Problem Relation Age of Onset  . Breast cancer Mother 6  . Cancer Mother        breast cancer 36 (age 54), tongue cancer 2011  . Diabetes Mother   . Colon cancer Maternal Grandmother   . Depression Brother   . Diabetes Father   . Hearing loss  Father   . Heart disease Maternal Grandfather   . Cancer Paternal Grandfather   . Diabetes Paternal Grandfather     Social History   Tobacco Use  . Smoking status: Never Smoker  . Smokeless tobacco: Never Used  Substance Use Topics  . Alcohol use: Yes    Comment: occasional drinks, no more than 3 in an evening     Current Outpatient Medications:  .  sertraline (ZOLOFT) 50 MG tablet, Take 1.5 tablets (75 mg total) by mouth daily. TAKE 1 TABLET BY MOUTH EVERY DAY, Disp: 135 tablet, Rfl: 0  No Known Allergies  I personally reviewed active problem list, medication list, allergies, family history, social history, health maintenance with the patient/caregiver today.   ROS  Constitutional: Negative for fever or weight change.  Respiratory: Negative for cough and shortness of breath.   Cardiovascular: Negative for chest pain or palpitations.  Gastrointestinal: Negative for abdominal pain, no bowel changes.  Musculoskeletal: Negative for gait problem or joint swelling.  Skin: Negative for rash.  Neurological: Negative for dizziness or headache.  No other specific complaints in a complete review of systems (except as listed in HPI above).  Objective  Vitals:   10/27/19 0747  BP: 100/62  Pulse: 100  Resp: 16  Temp: 98.3 F (36.8 C)  TempSrc: Oral  SpO2:  96%  Weight: 131 lb 4.8 oz (59.6 kg)  Height: 5\' 3"  (1.6 m)    Body mass index is 23.26 kg/m.  Physical Exam  Constitutional: Patient appears well-developed and well-nourished.  No distress.  HEENT: head atraumatic, normocephalic, pupils equal and reactive to light,  neck supple Cardiovascular: Normal rate, regular rhythm and normal heart sounds.  No murmur heard. No BLE edema. Pulmonary/Chest: Effort normal and breath sounds normal. No respiratory distress. Abdominal: Soft.  There is no tenderness. Psychiatric: Patient has a normal mood and affect. behavior is normal. Judgment and thought content  normal.  PHQ2/9: Depression screen [facility] 2/9 10/27/2019 05/11/2019 03/12/2019 09/10/2018 09/10/2018  Decreased Interest 0 0 0 0 0  Down, Depressed, Hopeless 0 0 0 0 0  PHQ - 2 Score 0 0 0 0 0  Altered sleeping 1 0 0 1 0  Tired, decreased energy 0 0 0 0 0  Change in appetite 0 0 0 0 0  Feeling bad or failure about yourself  0 0 0 1 0  Trouble concentrating 0 0 0 0 0  Moving slowly or fidgety/restless 0 0 0 0 0  Suicidal thoughts 0 0 0 0 0  PHQ-9 Score 1 0 0 2 0  Difficult doing work/chores Not difficult at all Not difficult at all Not difficult at all Not difficult at all -  Some recent data might be hidden    phq 9 is negative   Fall Risk: Fall Risk  10/27/2019 05/11/2019 03/12/2019 09/10/2018 05/25/2018  Falls in the past year? 0 0 0 0 0  Number falls in past yr: 0 0 0 0 0  Injury with Fall? 0 0 0 0 0  Follow up - Falls evaluation completed Falls evaluation completed - Falls evaluation completed    Functional Status Survey: Is the patient deaf or have difficulty hearing?: No Does the patient have difficulty seeing, even when wearing glasses/contacts?: No Does the patient have difficulty concentrating, remembering, or making decisions?: No Does the patient have difficulty walking or climbing stairs?: No Does the patient have difficulty dressing or bathing?: No Does the patient have difficulty doing errands alone such as visiting a doctor's office or shopping?: No  Assessment & Plan   1. Major depression in remission (HCC)  - sertraline (ZOLOFT) 50 MG tablet; Take 1.5 tablets (75 mg total) by mouth daily. TAKE 1 TABLET BY MOUTH EVERY DAY  Dispense: 135 tablet; Refill: 0  2. Gastroesophageal reflux disease without esophagitis  Doing well, no problems in a long time  3. Seasonal allergic rhinitis, unspecified trigger  Doing well at this time   4. Anxiety  - sertraline (ZOLOFT) 50 MG tablet; Take 1.5 tablets (75 mg total) by mouth daily. TAKE 1 TABLET BY MOUTH EVERY DAY   Dispense: 135 tablet; Refill: 0   5. Right elbow pain  Advised ice, rest

## 2019-11-22 ENCOUNTER — Encounter: Payer: Self-pay | Admitting: Dermatology

## 2020-01-19 ENCOUNTER — Other Ambulatory Visit: Payer: Self-pay | Admitting: Family Medicine

## 2020-01-19 DIAGNOSIS — F325 Major depressive disorder, single episode, in full remission: Secondary | ICD-10-CM

## 2020-01-19 DIAGNOSIS — F419 Anxiety disorder, unspecified: Secondary | ICD-10-CM

## 2020-02-12 ENCOUNTER — Other Ambulatory Visit: Payer: Self-pay

## 2020-02-12 DIAGNOSIS — Z20822 Contact with and (suspected) exposure to covid-19: Secondary | ICD-10-CM

## 2020-02-15 LAB — NOVEL CORONAVIRUS, NAA: SARS-CoV-2, NAA: NOT DETECTED

## 2020-04-13 ENCOUNTER — Other Ambulatory Visit: Payer: Self-pay | Admitting: Family Medicine

## 2020-04-13 DIAGNOSIS — F419 Anxiety disorder, unspecified: Secondary | ICD-10-CM

## 2020-04-13 DIAGNOSIS — F325 Major depressive disorder, single episode, in full remission: Secondary | ICD-10-CM

## 2020-04-13 NOTE — Telephone Encounter (Signed)
Requested Prescriptions  Pending Prescriptions Disp Refills  . sertraline (ZOLOFT) 50 MG tablet [Pharmacy Med Name: SERTRALINE HCL 50 MG TABLET] 135 tablet 0    Sig: TAKE 1.5 TABLETS (75 MG TOTAL) BY MOUTH DAILY. TAKE 1 TABLET BY MOUTH EVERY DAY     Psychiatry:  Antidepressants - SSRI Passed - 04/13/2020  2:08 AM      Passed - Completed PHQ-2 or PHQ-9 in the last 360 days      Passed - Valid encounter within last 6 months    Recent Outpatient Visits          5 months ago Major depression in remission [facility])   [facility] Steele Sizer, MD   11 months ago Well adult exam   [facility] Steele Sizer, MD   1 year ago Major depression in remission [facility])   [facility] Steele Sizer, MD   1 year ago [facility] Steele Sizer, MD   1 year ago Puncture wound   [place] [place]      Future Appointments            In 1 week Steele Sizer, MD [facility], [facility]

## 2020-04-24 NOTE — Progress Notes (Signed)
Name: Gwendolyn Hull   MRN: [id number]    DOB: [birth year]   Date:04/25/2020       Progress Note  Subjective  Chief Complaint  Follow Up  HPI  Major Depression: she has a long history of depression.She came in early March 2019 with worsening of symptoms.Phq9 was 12is zero today, sheis on Zoloft and Wellbutrin XL and seems to be working He moved back Nov 2019,she separated from [facility] 08/2018, she told him she was having an affair two years ago, she moved out 08/2018 she moved in with a roommate. 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 Emotionally she is doing well. Her youngest son moved with her , older two children live in [place]. Divorce is not final yet, but getting closer . She has noticed that she has been unable to reach an orgasm, we tried going down from 100 to 75 mg and it seemed to have improved. 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 Family history of breast cancer 03/22/2019  . Major depression, recurrent, chronic ([place]) 04/04/2017  . Anxiety 04/04/2017  . Seasonal allergic rhinitis 04/04/2017    Past Surgical History:  Procedure Laterality Date  . CESAREAN SECTION  01/1998, 10/1999, 04/2002    Family History  Problem Relation Age of Onset  . Breast cancer Mother 71  . Cancer Mother        breast cancer 89 (age 58), tongue cancer 2011  . Diabetes Mother   . Colon cancer Maternal Grandmother   . Depression Brother   . Diabetes Father   . Hearing loss Father   . Heart disease Maternal Grandfather    . Cancer Paternal Grandfather   . Diabetes Paternal Grandfather     Social History   Tobacco Use  . Smoking status: Never Smoker  . Smokeless tobacco: Never Used  Substance Use Topics  . Alcohol use: Yes    Comment: occasional drinks, no more than 3 in an evening     Current Outpatient Medications:  .  sertraline (ZOLOFT) 50 MG tablet, TAKE 1.5 TABLETS (75 MG TOTAL) BY MOUTH DAILY. TAKE 1 TABLET BY MOUTH EVERY DAY, Disp: 135 tablet, Rfl: 0  No Known Allergies  I personally reviewed active problem list, medication list, allergies, family history, social history, health maintenance with the patient/caregiver today.   ROS  Constitutional: Negative for fever or weight change.  Respiratory: Negative for cough and shortness of breath.   Cardiovascular: Negative for chest pain or palpitations.  Gastrointestinal: Negative for abdominal pain, no bowel changes.  Musculoskeletal: Negative for gait problem or joint swelling.  Skin: Negative for rash.  Neurological: Negative for dizziness or headache.  No other specific complaints in a complete review of systems (except as listed in HPI above).  Objective  Vitals:   04/25/20 0821  BP: 114/74  Pulse: (!) 102  Resp: 16  Temp: 97.8 F (36.6 C)  TempSrc: Oral  SpO2: 97%  Weight: 136 lb (61.7 kg)  Height: 5\' 3"  (  1.6 m)    Body mass index is 24.09 kg/m.  Physical Exam  Constitutional: Patient appears well-developed and well-nourished. Overweight. No distress.  HEENT: head atraumatic, normocephalic, pupils equal and reactive to light,  neck supple Cardiovascular: Normal rate, regular rhythm and normal heart sounds.  No murmur heard. No BLE edema. Pulmonary/Chest: Effort normal and breath sounds normal. No respiratory distress. Abdominal: Soft.  There is no tenderness. Psychiatric: Patient has a normal mood and affect. behavior is normal. Judgment and thought content normal.  Recent Results (from the past 2160 hour(s))   Novel Coronavirus, NAA ([facility])     Status: None   Collection Time: 02/12/20  8:49 AM   Specimen: Nasopharyngeal(NP) swabs in vial transport medium   Nasopharynge  Result Value Ref Range   SARS-CoV-2, NAA Not Detected Not Detected    Comment: This nucleic acid amplification test was developed and its performance characteristics determined by [employer]. Nucleic acid amplification tests include RT-PCR and TMA. This test has not been FDA cleared or approved. This test has been authorized by FDA under an Emergency Use Authorization (EUA). This test is only authorized for the duration of time the declaration that circumstances exist justifying the authorization of the emergency use of in vitro diagnostic tests for detection of SARS-CoV-2 virus and/or diagnosis of COVID-19 infection under section 564(b)(1) of the Act, 21 U.S.C. 829HBZ-1(I) (1), unless the authorization is terminated or revoked sooner. When diagnostic testing is negative, the possibility of a false negative result should be considered in the context of a patient's recent exposures and the presence of clinical signs and symptoms consistent with COVID-19. An individual without symptoms of COVID-19 and who is not shedding SARS-CoV-2 virus wo uld expect to have a negative (not detected) result in this assay.       PHQ2/9: Depression screen [facility] 2/9 04/25/2020 10/27/2019 05/11/2019 03/12/2019 09/10/2018  Decreased Interest 0 0 0 0 0  Down, Depressed, Hopeless 0 0 0 0 0  PHQ - 2 Score 0 0 0 0 0  Altered sleeping 0 1 0 0 1  Tired, decreased energy 0 0 0 0 0  Change in appetite 0 0 0 0 0  Feeling bad or failure about yourself  0 0 0 0 1  Trouble concentrating 0 0 0 0 0  Moving slowly or fidgety/restless 0 0 0 0 0  Suicidal thoughts 0 0 0 0 0  PHQ-9 Score 0 1 0 0 2  Difficult doing work/chores - Not difficult at all Not difficult at all Not difficult at all Not difficult at all  Some recent data might be hidden     phq 9 is negative   Fall Risk: Fall Risk  04/25/2020 10/27/2019 05/11/2019 03/12/2019 09/10/2018  Falls in the past year? 0 0 0 0 0  Number falls in past yr: 0 0 0 0 0  Injury with Fall? 0 0 0 0 0  Follow up - - Falls evaluation completed Falls evaluation completed -     Functional Status Survey: Is the patient deaf or have difficulty hearing?: No Does the patient have difficulty seeing, even when wearing glasses/contacts?: No Does the patient have difficulty concentrating, remembering, or making decisions?: No Does the patient have difficulty walking or climbing stairs?: No Does the patient have difficulty dressing or bathing?: No Does the patient have difficulty doing errands alone such as visiting a doctor's office or shopping?: No    Assessment & Plan  1. Major depression in remission ([place])  -  sertraline (ZOLOFT) 50 MG tablet; Take 1.5 tablets (75 mg total) by mouth daily. TAKE 1 TABLET BY MOUTH EVERY DAY  Dispense: 135 tablet; Refill: 1  2. Colon cancer screening  - Ambulatory referral to Gastroenterology  3. Seasonal allergic rhinitis, unspecified trigger   4. Gastroesophageal reflux disease without esophagitis  Under control   5. Anxiety  - sertraline (ZOLOFT) 50 MG tablet; Take 1.5 tablets (75 mg total) by mouth daily. TAKE 1 TABLET BY MOUTH EVERY DAY  Dispense: 135 tablet; Refill: 1

## 2020-04-25 ENCOUNTER — Ambulatory Visit (INDEPENDENT_AMBULATORY_CARE_PROVIDER_SITE_OTHER): Payer: BC Managed Care – PPO | Admitting: Family Medicine

## 2020-04-25 ENCOUNTER — Encounter: Payer: Self-pay | Admitting: Family Medicine

## 2020-04-25 ENCOUNTER — Other Ambulatory Visit: Payer: Self-pay

## 2020-04-25 VITALS — BP 114/74 | HR 102 | Temp 97.8°F | Resp 16 | Ht 63.0 in | Wt 136.0 lb

## 2020-04-25 DIAGNOSIS — J302 Other seasonal allergic rhinitis: Secondary | ICD-10-CM

## 2020-04-25 DIAGNOSIS — K219 Gastro-esophageal reflux disease without esophagitis: Secondary | ICD-10-CM | POA: Diagnosis not present

## 2020-04-25 DIAGNOSIS — F325 Major depressive disorder, single episode, in full remission: Secondary | ICD-10-CM | POA: Diagnosis not present

## 2020-04-25 DIAGNOSIS — F419 Anxiety disorder, unspecified: Secondary | ICD-10-CM

## 2020-04-25 DIAGNOSIS — Z1211 Encounter for screening for malignant neoplasm of colon: Secondary | ICD-10-CM

## 2020-04-25 MED ORDER — SERTRALINE HCL 50 MG PO TABS: 75.0000 mg | ORAL_TABLET | Freq: Every day | ORAL | 1 refills | Status: DC

## 2020-05-10 ENCOUNTER — Other Ambulatory Visit: Payer: Self-pay

## 2020-05-10 ENCOUNTER — Encounter: Payer: Self-pay | Admitting: Physician Assistant

## 2020-05-10 ENCOUNTER — Ambulatory Visit (INDEPENDENT_AMBULATORY_CARE_PROVIDER_SITE_OTHER): Payer: BC Managed Care – PPO | Admitting: Physician Assistant

## 2020-05-10 VITALS — BP 120/78 | HR 98 | Temp 98.5°F | Resp 16 | Ht 63.0 in | Wt 136.5 lb

## 2020-05-10 DIAGNOSIS — Z0000 Encounter for general adult medical examination without abnormal findings: Secondary | ICD-10-CM

## 2020-05-10 DIAGNOSIS — Z1231 Encounter for screening mammogram for malignant neoplasm of breast: Secondary | ICD-10-CM

## 2020-05-10 NOTE — Progress Notes (Signed)
Complete physical exam   Patient: Gwendolyn Hull   DOB: 01-14-1976   45 y.o. Female  MRN: [id number] Visit Date: 05/10/2020  Today's healthcare provider: Trinna Post, PA-C   Chief Complaint  Patient presents with  . Annual Exam   Subjective    Gwendolyn Hull is a 45 y.o. female who presents today for a complete physical exam.  She reports consuming a general diet. Home exercise routine includes walking. She generally feels well. She reports sleeping well. She does not have additional problems to discuss today.  HPI    Past Medical History:  Diagnosis Date  . Allergy 1997   mild, seasonal  . Anxiety fall 1996   concurrent with depression; see dates above  . Depression fall 1996   episodes fall 1996, and postpartum fall 2000 & summer 2002  . Dysplastic nevus 12/08/2012   Right paraspinal upper back. Moderate atypia, lateral margin involved.   Marland Kitchen Dysplastic nevus 12/08/2012   Right medial mid scapula. Moderate atypia, close to margin.   Marland Kitchen Dysplastic nevus 06/07/2013   Right paraspinal upper back. Moderate atypia, lateral margin involved.   Marland Kitchen GERD (gastroesophageal reflux disease) 2010(?)   occasional, mild, and stress-related   Past Surgical History:  Procedure Laterality Date  . CESAREAN SECTION  01/1998, 10/1999, 04/2002   Social History   Socioeconomic History  . Marital status: Legally Separated    Spouse name: Rodman Key  . Number of children: 3  . Years of education: Not on file  . Highest education level: Bachelor's degree (e.g., BA, AB, BS)  Occupational History  . Occupation: Pharmacist, hospital   Tobacco Use  . Smoking status: Never Smoker  . Smokeless tobacco: Never Used  Vaping Use  . Vaping Use: Never used  Substance and Sexual Activity  . Alcohol use: Yes    Comment: occasional drinks, no more than 3 in an evening  . Drug use: No  . Sexual activity: Yes    Partners: Male    Birth control/protection: I.U.D.  Other Topics Concern  . Not on file  Social  History Narrative   Married for the past 13 years   Three sons, youngest is in [place]    She was teaching full time, but now only subbing and trying to go back to job at [employer] moved back home Nov 2019, but now they are separated she confessed that she was having an affair in [place] before she moved back.    Two older sons in college in [place] Strain: [place]   . Difficulty of Paying Living Expenses: Not hard at all  Food Insecurity: No Food Insecurity  . Worried About Charity fundraiser in the Last Year: Never true  . Ran Out of Food in the Last Year: Never true  Transportation Needs: No Transportation Needs  . Lack of Transportation (Medical): No  . Lack of Transportation (Non-Medical): No  Physical Activity: Insufficiently Active  . Days of Exercise per Week: 2 days  . Minutes of Exercise per Session: 40 min  Stress: No Stress Concern Present  . Feeling of Stress : Not at all  Social Connections: Moderately Isolated  . Frequency of Communication with Friends and Family: More than three times a week  . Frequency of Social Gatherings with Friends and Family: More than three times a week  . Attends Religious Services: Never  . Active Member of Clubs  or Organizations: Yes  . Attends Archivist Meetings: More than 4 times per year  . Marital Status: Separated  Intimate Partner Violence: Not At Risk  . Fear of Current or Ex-Partner: No  . Emotionally Abused: No  . Physically Abused: No  . Sexually Abused: No   Family Status  Relation Name Status  . Mother Lindley Magnus Alive  . MGM  Deceased  . Brother [employer]  . Father [employer]  . MGF  Deceased  . PGM  Deceased  . PGF  Deceased   Family History  Problem Relation Age of Onset  . Breast cancer Mother 64  . Cancer Mother        breast cancer 43 (age 79), tongue cancer 2011  . Diabetes Mother   . Colon cancer Maternal  Grandmother   . Depression Brother   . Diabetes Father   . Hearing loss Father   . Heart disease Maternal Grandfather   . Cancer Paternal Grandfather   . Diabetes Paternal Grandfather    No Known Allergies  Patient Care Team: Steele Sizer, MD as PCP - General (Family Medicine) Ralene Bathe, MD as Consulting Physician (Dermatology)   Medications: Outpatient Medications Prior to Visit  Medication Sig  . sertraline (ZOLOFT) 50 MG tablet Take 1.5 tablets (75 mg total) by mouth daily. TAKE 1 TABLET BY MOUTH EVERY DAY   No facility-administered medications prior to visit.    Review of Systems    Objective    BP 120/78   Pulse 98   Temp 98.5 F (36.9 C) (Oral)   Resp 16   Ht 5\' 3"  (1.6 m)   Wt 136 lb 8 oz (61.9 kg)   LMP 05/10/2020   SpO2 99%   BMI 24.18 kg/m    Physical Exam Constitutional:      Appearance: Normal appearance.  HENT:     Right Ear: Tympanic membrane and ear canal normal.     Left Ear: Tympanic membrane and ear canal normal.  Cardiovascular:     Rate and Rhythm: Normal rate and regular rhythm.     Heart sounds: Normal heart sounds.  Pulmonary:     Effort: Pulmonary effort is normal.     Breath sounds: Normal breath sounds.  Abdominal:     General: Bowel sounds are normal.     Palpations: Abdomen is soft.  Musculoskeletal:     Cervical back: Normal range of motion and neck supple.  Lymphadenopathy:     Cervical: No cervical adenopathy.  Skin:    General: Skin is warm and dry.  Neurological:     Mental Status: She is alert and oriented to person, place, and time. Mental status is at baseline.  Psychiatric:        Mood and Affect: Mood normal.        Behavior: Behavior normal.       Last depression screening scores PHQ 2/9 Scores 05/10/2020 04/25/2020 10/27/2019  PHQ - 2 Score 0 0 0  PHQ- 9 Score 0 0 1   Last fall risk screening Fall Risk  05/10/2020  Falls in the past year? 0  Number falls in past yr: 0  Injury with Fall? 0   Follow up Falls evaluation completed   Last Audit-C alcohol use screening Alcohol Use Disorder Test (AUDIT) 05/10/2020  1. How often do you have a drink containing alcohol? 0  2. How many drinks containing alcohol do you have on a typical day when  you are drinking? 0  3. How often do you have six or more drinks on one occasion? 0  AUDIT-C Score 0   A score of 3 or more in women, and 4 or more in men indicates increased risk for alcohol abuse, EXCEPT if all of the points are from question 1   No results found for any visits on 05/10/20.  Assessment & Plan    Routine Health Maintenance and Physical Exam  Exercise Activities and Dietary recommendations Goals   None     Immunization History  Administered Date(s) Administered  . Influenza,inj,Quad PF,6+ Mos 10/09/2017  . Influenza-Unspecified 12/31/2019  . Moderna Sars-Covid-2 Vaccination 03/27/2019, 04/24/2019, 12/31/2019  . Tdap 03/29/2014    Health Maintenance  Topic Date Due  . COLONOSCOPY (Pts 45-47yrs Insurance coverage will need to be confirmed)  Never done  . INFLUENZA VACCINE  08/28/2020  . PAP SMEAR-Modifier  10/10/2022  . TETANUS/TDAP  03/28/2024  . COVID-19 Vaccine  Completed  . Hepatitis C Screening  Completed  . HIV Screening  Completed  . HPV VACCINES  Aged Out    Discussed health benefits of physical activity, and encouraged her to engage in regular exercise appropriate for her age and condition.  1. Annual physical exam  She has referral for colonoscopy, will check with insurance and set up. Return for fasting labs. CPE form completed and given to patient before visit's end.  - TSH - Lipid panel - Comprehensive metabolic panel - CBC with Differential/Platelet  2. Encounter for screening mammogram for malignant neoplasm of breast  Ordered today, mother with breast cancer at 72.   - MM Digital Screening; Future   Return in about 1 year (around 05/10/2021) for cpe.        Trinna Post,  PA-C  [facility] [phone] (phone) [phone] (fax)  [place]

## 2020-05-10 NOTE — Patient Instructions (Signed)
Health Maintenance, Female Adopting a healthy lifestyle and getting preventive care are important in promoting health and wellness. Ask your health care provider about:  The right schedule for you to have regular tests and exams.  Things you can do on your own to prevent diseases and keep yourself healthy. What should I know about diet, weight, and exercise? Eat a healthy diet  Eat a diet that includes plenty of vegetables, fruits, low-fat dairy products, and lean protein.  Do not eat a lot of foods that are high in solid fats, added sugars, or sodium.   Maintain a healthy weight Body mass index (BMI) is used to identify weight problems. It estimates body fat based on height and weight. Your health care provider can help determine your BMI and help you achieve or maintain a healthy weight. Get regular exercise Get regular exercise. This is one of the most important things you can do for your health. Most adults should:  Exercise for at least 150 minutes each week. The exercise should increase your heart rate and make you sweat (moderate-intensity exercise).  Do strengthening exercises at least twice a week. This is in addition to the moderate-intensity exercise.  Spend less time sitting. Even light physical activity can be beneficial. Watch cholesterol and blood lipids Have your blood tested for lipids and cholesterol at 47 years of age, then have this test every 5 years. Have your cholesterol levels checked more often if:  Your lipid or cholesterol levels are high.  You are older than 47 years of age.  You are at high risk for heart disease. What should I know about cancer screening? Depending on your health history and family history, you may need to have cancer screening at various ages. This may include screening for:  Breast cancer.  Cervical cancer.  Colorectal cancer.  Skin cancer.  Lung cancer. What should I know about heart disease, diabetes, and high blood  pressure? Blood pressure and heart disease  High blood pressure causes heart disease and increases the risk of stroke. This is more likely to develop in people who have high blood pressure readings, are of African descent, or are overweight.  Have your blood pressure checked: ? Every 3-5 years if you are 18-39 years of age. ? Every year if you are 40 years old or older. Diabetes Have regular diabetes screenings. This checks your fasting blood sugar level. Have the screening done:  Once every three years after age 40 if you are at a normal weight and have a low risk for diabetes.  More often and at a younger age if you are overweight or have a high risk for diabetes. What should I know about preventing infection? Hepatitis B If you have a higher risk for hepatitis B, you should be screened for this virus. Talk with your health care provider to find out if you are at risk for hepatitis B infection. Hepatitis C Testing is recommended for:  Everyone born from 1945 through 1965.  Anyone with known risk factors for hepatitis C. Sexually transmitted infections (STIs)  Get screened for STIs, including gonorrhea and chlamydia, if: ? You are sexually active and are younger than 47 years of age. ? You are older than 47 years of age and your health care provider tells you that you are at risk for this type of infection. ? Your sexual activity has changed since you were last screened, and you are at increased risk for chlamydia or gonorrhea. Ask your health care provider   if you are at risk.  Ask your health care provider about whether you are at high risk for HIV. Your health care provider may recommend a prescription medicine to help prevent HIV infection. If you choose to take medicine to prevent HIV, you should first get tested for HIV. You should then be tested every 3 months for as long as you are taking the medicine. Pregnancy  If you are about to stop having your period (premenopausal) and  you may become pregnant, seek counseling before you get pregnant.  Take 400 to 800 micrograms (mcg) of folic acid every day if you become pregnant.  Ask for birth control (contraception) if you want to prevent pregnancy. Osteoporosis and menopause Osteoporosis is a disease in which the bones lose minerals and strength with aging. This can result in bone fractures. If you are 65 years old or older, or if you are at risk for osteoporosis and fractures, ask your health care provider if you should:  Be screened for bone loss.  Take a calcium or vitamin D supplement to lower your risk of fractures.  Be given hormone replacement therapy (HRT) to treat symptoms of menopause. Follow these instructions at home: Lifestyle  Do not use any products that contain nicotine or tobacco, such as cigarettes, e-cigarettes, and chewing tobacco. If you need help quitting, ask your health care provider.  Do not use street drugs.  Do not share needles.  Ask your health care provider for help if you need support or information about quitting drugs. Alcohol use  Do not drink alcohol if: ? Your health care provider tells you not to drink. ? You are pregnant, may be pregnant, or are planning to become pregnant.  If you drink alcohol: ? Limit how much you use to 0-1 drink a day. ? Limit intake if you are breastfeeding.  Be aware of how much alcohol is in your drink. In the [place] one drink equals one 12 oz bottle of beer (355 mL), one 5 oz glass of wine (148 mL), or one 1 oz glass of hard liquor (44 mL). General instructions  Schedule regular health, dental, and eye exams.  Stay current with your vaccines.  Tell your health care provider if: ? You often feel depressed. ? You have ever been abused or do not feel safe at home. Summary  Adopting a healthy lifestyle and getting preventive care are important in promoting health and wellness.  Follow your health care provider's instructions about healthy  diet, exercising, and getting tested or screened for diseases.  Follow your health care provider's instructions on monitoring your cholesterol and blood pressure. This information is not intended to replace advice given to you by your health care provider. Make sure you discuss any questions you have with your health care provider. Document Revised: 01/07/2018 Document Reviewed: 01/07/2018 Elsevier Patient Education  2021 Elsevier Inc.  

## 2020-05-12 ENCOUNTER — Encounter: Payer: 59 | Admitting: Family Medicine

## 2020-05-18 ENCOUNTER — Encounter: Payer: Self-pay | Admitting: *Deleted

## 2020-05-25 ENCOUNTER — Telehealth: Payer: Self-pay

## 2020-05-25 NOTE — Telephone Encounter (Signed)
Called patient to schedule. No answer, I left a vmail confirming that we received her message and we're ready to schedule her.

## 2020-05-25 NOTE — Telephone Encounter (Signed)
Patient left voicemail. Wants to schedule her coloscopy now that the insurance guidelines has changed. Already called company to verify. Referral is in place, will call the patient to schedule.

## 2020-08-01 ENCOUNTER — Telehealth (INDEPENDENT_AMBULATORY_CARE_PROVIDER_SITE_OTHER): Payer: BC Managed Care – PPO | Admitting: Gastroenterology

## 2020-08-01 DIAGNOSIS — Z800 Family history of malignant neoplasm of digestive organs: Secondary | ICD-10-CM

## 2020-08-01 DIAGNOSIS — Z1211 Encounter for screening for malignant neoplasm of colon: Secondary | ICD-10-CM

## 2020-08-01 MED ORDER — CLENPIQ 10-3.5-12 MG-GM -GM/160ML PO SOLN: 1.0000 | Freq: Once | ORAL | 0 refills | Status: AC

## 2020-08-01 NOTE — Progress Notes (Signed)
Gastroenterology Pre-Procedure Review  Request Date: 09/06/2020 Requesting Physician: Dr. Bonna Gains   PATIENT REVIEW QUESTIONS: The patient responded to the following health history questions as indicated:    1. Are you having any GI issues? no 2. Do you have a personal history of Polyps?  N/A  3. Do you have a family history of Colon Cancer or Polyps? yes (Father and Maternal Grandmother have colon cancer) 4. Diabetes Mellitus? no 5. Joint replacements in the past 12 months?no 6. Major health problems in the past 3 months?no 7. Any artificial heart valves, MVP, or defibrillator?no    MEDICATIONS & ALLERGIES:    Patient reports the following regarding taking any anticoagulation/antiplatelet therapy:   Plavix, Coumadin, Eliquis, Xarelto, Lovenox, Pradaxa, Brilinta, or Effient? no Aspirin? no  Patient confirms/reports the following medications:  Current Outpatient Medications  Medication Sig Dispense Refill   sertraline (ZOLOFT) 50 MG tablet Take 1.5 tablets (75 mg total) by mouth daily. TAKE 1 TABLET BY MOUTH EVERY DAY 135 tablet 1   Sod Picosulfate-Mag Ox-Cit Acd (CLENPIQ) 10-3.5-12 MG-GM -GM/160ML SOLN Take 1 kit by mouth once for 1 dose. 320 mL 0   No current facility-administered medications for this visit.    Patient confirms/reports the following allergies:  No Known Allergies  No orders of the defined types were placed in this encounter.   AUTHORIZATION INFORMATION Primary Insurance: 1D#: Group #:  Secondary Insurance: 1D#: Group #:  SCHEDULE INFORMATION: Date: 09/06/2020  Time: Location:  [place]

## 2020-09-06 ENCOUNTER — Encounter
Admission: RE | Disposition: A | Discharge status: Home / Self Care | Payer: Self-pay | Source: Home / Self Care | Attending: Gastroenterology

## 2020-09-06 ENCOUNTER — Ambulatory Visit
Admission: RE | Admit: 2020-09-06 | Discharge: 2020-09-06 | Disposition: A | Discharge status: Home / Self Care | Payer: BC Managed Care – PPO | Attending: Gastroenterology | Admitting: Gastroenterology

## 2020-09-06 ENCOUNTER — Ambulatory Visit: Payer: BC Managed Care – PPO | Admitting: Anesthesiology

## 2020-09-06 DIAGNOSIS — Z800 Family history of malignant neoplasm of digestive organs: Secondary | ICD-10-CM | POA: Insufficient documentation

## 2020-09-06 DIAGNOSIS — D124 Benign neoplasm of descending colon: Secondary | ICD-10-CM | POA: Diagnosis not present

## 2020-09-06 DIAGNOSIS — K635 Polyp of colon: Secondary | ICD-10-CM | POA: Diagnosis not present

## 2020-09-06 DIAGNOSIS — D120 Benign neoplasm of cecum: Secondary | ICD-10-CM | POA: Diagnosis not present

## 2020-09-06 DIAGNOSIS — D122 Benign neoplasm of ascending colon: Secondary | ICD-10-CM | POA: Insufficient documentation

## 2020-09-06 DIAGNOSIS — Z79899 Other long term (current) drug therapy: Secondary | ICD-10-CM | POA: Insufficient documentation

## 2020-09-06 DIAGNOSIS — Z1211 Encounter for screening for malignant neoplasm of colon: Secondary | ICD-10-CM | POA: Insufficient documentation

## 2020-09-06 HISTORY — PX: COLONOSCOPY WITH PROPOFOL: SHX5780

## 2020-09-06 LAB — POCT PREGNANCY, URINE: Preg Test, Ur: NEGATIVE

## 2020-09-06 SURGERY — COLONOSCOPY WITH PROPOFOL
Anesthesia: General

## 2020-09-06 MED ORDER — SODIUM CHLORIDE 0.9 % IV SOLN
INTRAVENOUS | Status: DC
  Administered 2020-09-06: 20 mL/h via INTRAVENOUS

## 2020-09-06 MED ORDER — PROPOFOL 500 MG/50ML IV EMUL
INTRAVENOUS | Status: DC | PRN
  Administered 2020-09-06: 140 ug/kg/min via INTRAVENOUS

## 2020-09-06 MED ORDER — LIDOCAINE HCL (CARDIAC) PF 100 MG/5ML IV SOSY
PREFILLED_SYRINGE | INTRAVENOUS | Status: DC | PRN
  Administered 2020-09-06: 100 mg via INTRAVENOUS

## 2020-09-06 MED ORDER — PROPOFOL 500 MG/50ML IV EMUL
INTRAVENOUS | Status: AC
  Filled 2020-09-06: qty 100

## 2020-09-06 MED ORDER — PROPOFOL 10 MG/ML IV BOLUS
INTRAVENOUS | Status: DC | PRN
  Administered 2020-09-06: 70 mg via INTRAVENOUS

## 2020-09-06 MED ORDER — PHENYLEPHRINE HCL (PRESSORS) 10 MG/ML IV SOLN
INTRAVENOUS | Status: AC
  Filled 2020-09-06: qty 1

## 2020-09-06 NOTE — Anesthesia Preprocedure Evaluation (Signed)
Anesthesia Evaluation  Patient identified by MRN, date of birth, ID band Patient awake    Reviewed: Allergy & Precautions, NPO status , Patient's Chart, lab work & pertinent test results  History of Anesthesia Complications Negative for: history of anesthetic complications  Airway Mallampati: I  TM Distance: >3 FB Neck ROM: Full    Dental no notable dental hx.    Pulmonary neg pulmonary ROS, neg sleep apnea, neg COPD,    breath sounds clear to auscultation- rhonchi (-) wheezing      Cardiovascular Exercise Tolerance: Good (-) hypertension(-) CAD, (-) Past MI, (-) Cardiac Stents and (-) CABG  Rhythm:Regular Rate:Normal - Systolic murmurs and - Diastolic murmurs    Neuro/Psych neg Seizures PSYCHIATRIC DISORDERS Anxiety Depression negative neurological ROS     GI/Hepatic Neg liver ROS, GERD  ,  Endo/Other  negative endocrine ROSneg diabetes  Renal/GU negative Renal ROS     Musculoskeletal negative musculoskeletal ROS (+)   Abdominal (+) - obese,   Peds  Hematology negative hematology ROS (+)   Anesthesia Other Findings Past Medical History: 1997: Allergy     Comment:  mild, seasonal fall 1996: Anxiety     Comment:  concurrent with depression; see dates above fall 1996: Depression     Comment:  episodes fall 1996, and postpartum fall 2000 & summer               2002 12/08/2012: Dysplastic nevus     Comment:  Right paraspinal upper back. Moderate atypia, lateral               margin involved.  12/08/2012: Dysplastic nevus     Comment:  Right medial mid scapula. Moderate atypia, close to               margin.  06/07/2013: Dysplastic nevus     Comment:  Right paraspinal upper back. Moderate atypia, lateral               margin involved.  2010(?): GERD (gastroesophageal reflux disease)     Comment:  occasional, mild, and stress-related   Reproductive/Obstetrics                              Anesthesia Physical Anesthesia Plan  ASA: 2  Anesthesia Plan: General   Post-op Pain Management:    Induction: Intravenous  PONV Risk Score and Plan: 2 and Propofol infusion  Airway Management Planned: Natural Airway  Additional Equipment:   Intra-op Plan:   Post-operative Plan:   Informed Consent: I have reviewed the patients History and Physical, chart, labs and discussed the procedure including the risks, benefits and alternatives for the proposed anesthesia with the patient or authorized representative who has indicated his/her understanding and acceptance.     Dental advisory given  Plan Discussed with: CRNA and Anesthesiologist  Anesthesia Plan Comments:         Anesthesia Quick Evaluation

## 2020-09-06 NOTE — Op Note (Signed)
[facility] Gastroenterology Patient Name: Gwendolyn Hull Procedure Date: 09/06/2020 6:53 AM MRN: [id number] Account #: [id number] Date of Birth: 07/06/1975 Admit Type: Outpatient Age: 45 Room: [facility] ENDO ROOM 3 Gender: Female Note Status: Finalized Procedure:             Colonoscopy Indications:           Screening in patient at increased risk: Family history                         of 1st-degree relative with colorectal cancer Providers:             Cally Nygard B. Bonna Gains MD, MD Referring MD:          Bethena Roys. Sowles, MD (Referring MD) Medicines:             Monitored Anesthesia Care Complications:         No immediate complications. Procedure:             Pre-Anesthesia Assessment:                        - ASA Grade Assessment: II - A patient with mild                         systemic disease.                        - Prior to the procedure, a History and Physical was                         performed, and patient medications, allergies and                         sensitivities were reviewed. The patient's tolerance                         of previous anesthesia was reviewed.                        - The risks and benefits of the procedure and the                         sedation options and risks were discussed with the                         patient. All questions were answered and informed                         consent was obtained.                        - Patient identification and proposed procedure were                         verified prior to the procedure by the physician, the                         nurse, the anesthesiologist, the anesthetist and the  technician. The procedure was verified in the                         procedure room.                        After obtaining informed consent, the colonoscope was                         passed under direct vision. Throughout the procedure,                         the patient's  blood pressure, pulse, and oxygen                         saturations were monitored continuously. The                         Colonoscope was introduced through the anus and                         advanced to the the cecum, identified by appendiceal                         orifice and ileocecal valve. The colonoscopy was                         performed with ease. The patient tolerated the                         procedure well. The quality of the bowel preparation                         was good. Findings:      The perianal and digital rectal examinations were normal.      A 10 mm polyp was found in the cecum. The polyp was flat. The polyp was       removed with a piecemeal technique using a cold snare. Resection and       retrieval were complete. Small amount of polyp tissue was seen at the       margin after initial resection with cold snare. This was removed as a       second piece. No further polyp tissue was left at the site on       examination with NBI as well. Imaging was performed using white light       and narrow band imaging to visualize the mucosa.      Two sessile polyps were found in the cecum. The polyps were 4 to 6 mm in       size. These polyps were removed with a cold snare. Resection and       retrieval were complete.      A 10 mm polyp was found in the ascending colon. The polyp was sessile.       The polyp was removed with a cold snare. Resection and retrieval were       complete.      A 6 mm polyp was found in the descending colon. The polyp was sessile.       The polyp was removed with a cold snare. Resection  and retrieval were       complete.      The exam was otherwise without abnormality.      The rectum, sigmoid colon, descending colon, transverse colon, ascending       colon and cecum appeared normal.      The retroflexed view of the distal rectum and anal verge was normal and       showed no anal or rectal abnormalities. Impression:            - One  10 mm polyp in the cecum, removed piecemeal                         using a cold snare. Resected and retrieved.                        - Two 4 to 6 mm polyps in the cecum, removed with a                         cold snare. Resected and retrieved.                        - One 10 mm polyp in the ascending colon, removed with                         a cold snare. Resected and retrieved.                        - One 6 mm polyp in the descending colon, removed with                         a cold snare. Resected and retrieved.                        - The examination was otherwise normal.                        - The rectum, sigmoid colon, descending colon,                         transverse colon, ascending colon and cecum are normal.                        - The distal rectum and anal verge are normal on                         retroflexion view. Recommendation:        - Await pathology results.                        - Discharge patient to home (with escort).                        - Advance diet as tolerated.                        - Continue present medications.                        - Repeat colonoscopy date to be determined  after                         pending pathology results are reviewed.                        - The findings and recommendations were discussed with                         the patient.                        - The findings and recommendations were discussed with                         the patient's family.                        - Return to primary care physician as previously                         scheduled. Procedure Code(s):     --- Professional ---                        [phone], Colonoscopy, flexible; with removal of                         tumor(s), polyp(s), or other lesion(s) by snare                         technique Diagnosis Code(s):     --- Professional ---                        K63.5, Polyp of colon                        Z80.0, Family history of  malignant neoplasm of                         digestive organs CPT copyright 2019 American Medical Association. All rights reserved. The codes documented in this report are preliminary and upon coder review may  be revised to meet current compliance requirements.  Vonda Antigua, MD Margretta Sidle B. Bonna Gains MD, MD 09/06/2020 8:34:21 AM This report has been signed electronically. Number of Addenda: 0 Note Initiated On: 09/06/2020 6:53 AM Scope Withdrawal Time: 0 hours 26 minutes 34 seconds  Total Procedure Duration: 0 hours 32 minutes 40 seconds  Estimated Blood Loss:  Estimated blood loss: none.      [facility], [place]

## 2020-09-06 NOTE — Transfer of Care (Signed)
Immediate Anesthesia Transfer of Care Note  Patient: Gwendolyn Hull  Procedure(s) Performed: COLONOSCOPY WITH PROPOFOL  Patient Location: PACU  Anesthesia Type:General  Level of Consciousness: awake, alert  and oriented  Airway & Oxygen Therapy: Patient Spontanous Breathing and Patient connected to nasal cannula oxygen  Post-op Assessment: Report given to RN and Post -op Vital signs reviewed and stable  Post vital signs: Reviewed and stable  Last Vitals:  Vitals Value Taken Time  BP 99/75 09/06/20 0824  Temp    Pulse 84 09/06/20 0824  Resp 16 09/06/20 0824  SpO2 100 % 09/06/20 0824  Vitals shown include unvalidated device data.  Last Pain:  Vitals:   09/06/20 0718  TempSrc: Temporal  PainSc: 0-No pain         Complications: No notable events documented.

## 2020-09-06 NOTE — Anesthesia Postprocedure Evaluation (Signed)
Anesthesia Post Note  Patient: Chemical engineer  Procedure(s) Performed: COLONOSCOPY WITH PROPOFOL  Patient location during evaluation: Endoscopy Anesthesia Type: General Level of consciousness: awake and alert and oriented Pain management: pain level controlled Vital Signs Assessment: post-procedure vital signs reviewed and stable Respiratory status: spontaneous breathing, nonlabored ventilation and respiratory function stable Cardiovascular status: blood pressure returned to baseline and stable Postop Assessment: no signs of nausea or vomiting Anesthetic complications: no   No notable events documented.   Last Vitals:  Vitals:   09/06/20 0842 09/06/20 0852  BP: 102/72 104/72  Pulse:    Resp:    Temp:    SpO2:      Last Pain:  Vitals:   09/06/20 0852  TempSrc:   PainSc: 0-No pain                 Gwendolyn Hull

## 2020-09-06 NOTE — H&P (Signed)
Vonda Antigua, MD [address] [place], [place], [place], [address] [address], [place], [place], [place], [address] Phone: [phone]  Fax: [phone]  Primary Care Physician:  Steele Sizer, MD   Pre-Procedure History & Physical: HPI:  Gwendolyn Hull is a 45 y.o. female is here for a colonoscopy.   Past Medical History:  Diagnosis Date   Allergy 1997   mild, seasonal   Anxiety fall 1996   concurrent with depression; see dates above   Depression fall 1996   episodes fall 1996, and postpartum fall 2000 & summer 2002   Dysplastic nevus 12/08/2012   Right paraspinal upper back. Moderate atypia, lateral margin involved.    Dysplastic nevus 12/08/2012   Right medial mid scapula. Moderate atypia, close to margin.    Dysplastic nevus 06/07/2013   Right paraspinal upper back. Moderate atypia, lateral margin involved.    GERD (gastroesophageal reflux disease) 2010(?)   occasional, mild, and stress-related    Past Surgical History:  Procedure Laterality Date   CESAREAN SECTION  01/1998, 10/1999, 04/2002    Prior to Admission medications   Medication Sig Start Date End Date Taking? Authorizing Provider  sertraline (ZOLOFT) 50 MG tablet Take 1.5 tablets (75 mg total) by mouth daily. TAKE 1 TABLET BY MOUTH EVERY DAY 04/25/20  Yes Steele Sizer, MD    Allergies as of 08/01/2020   (No Known Allergies)    Family History  Problem Relation Age of Onset   Breast cancer Mother 7   Cancer Mother        breast cancer 70 (age 84), tongue cancer 2011   Diabetes Mother    Colon cancer Maternal Grandmother    Depression Brother    Diabetes Father    Hearing loss Father    Heart disease Maternal Grandfather    Cancer Paternal Grandfather    Diabetes Paternal Grandfather     Social History   Socioeconomic History   Marital status: Legally Separated    Spouse name: Rodman Key   Number of children: 3   Years of education: Not on file   Highest education level:  Bachelor's degree (e.g., BA, AB, BS)  Occupational History   Occupation: Pharmacist, hospital   Tobacco Use   Smoking status: Never   Smokeless tobacco: Never  Vaping Use   Vaping Use: Never used  Substance and Sexual Activity   Alcohol use: Yes    Comment: occasional drinks, no more than 3 in an evening   Drug use: No   Sexual activity: Yes    Partners: Male    Birth control/protection: I.U.D.  Other Topics Concern   Not on file  Social History Narrative   Married for the past 22 years   Three sons, youngest is in 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    Two older sons in college in [place] Strain: Low Risk    Difficulty of Paying Living Expenses: Not hard at all  Food Insecurity: No Food Insecurity   Worried About Charity fundraiser in the Last Year: Never true   Arboriculturist in the Last Year: Never true  Transportation Needs: No Transportation Needs   Lack of Transportation (  Medical): No   Lack of Transportation (Non-Medical): No  Physical Activity: Insufficiently Active   Days of Exercise per Week: 2 days   Minutes of Exercise per Session: 40 min  Stress: No Stress Concern Present   Feeling of Stress : Not at all  Social Connections: Moderately Isolated   Frequency of Communication with Friends and Family: More than three times a week   Frequency of Social Gatherings with Friends and Family: More than three times a week   Attends Religious Services: Never   Marine scientist or Organizations: Yes   Attends Music therapist: More than 4 times per year   Marital Status: Separated  Intimate Partner Violence: Not At Risk   Fear of Current or Ex-Partner: No   Emotionally Abused: No   Physically Abused: No    Sexually Abused: No    Review of Systems: See HPI, otherwise negative ROS  Physical Exam: Constitutional: General:   Alert,  Well-developed, well-nourished, pleasant and cooperative in NAD BP 125/89   Pulse 79   Temp 98 F (36.7 C) (Temporal)   Resp 20   Ht '5\' 3"'$  (1.6 m)   Wt 59 kg   SpO2 99%   BMI 23.03 kg/m   Head: Normocephalic, atraumatic.   Eyes:  Sclera clear, no icterus.   Conjunctiva pink.   Mouth:  No deformity or lesions, oropharynx pink & moist.  Neck:  Supple, trachea midline  Respiratory: Normal respiratory effort  Gastrointestinal:  Soft, non-tender and non-distended without masses, hepatosplenomegaly or hernias noted.  No guarding or rebound tenderness.     Cardiac: No clubbing or edema.  No cyanosis. Normal posterior tibial pedal pulses noted.  Lymphatic:  No significant cervical adenopathy.  Psych:  Alert and cooperative. Normal mood and affect.  Musculoskeletal:   Symmetrical without gross deformities. 5/5 Lower extremity strength bilaterally.  Skin: Warm. Intact without significant lesions or rashes. No jaundice.  Neurologic:  Face symmetrical, tongue midline, Normal sensation to touch;  grossly normal neurologically.  Psych:  Alert and oriented x3, Alert and cooperative. Normal mood and affect.  Impression/Plan: Gwendolyn Hull is here for a colonoscopy to be performed for family history of colon cancer in father.  Risks, benefits, limitations, and alternatives regarding  colonoscopy have been reviewed with the patient.  Questions have been answered.  All parties agreeable.   Virgel Manifold, MD  09/06/2020, 7:37 AM

## 2020-09-07 ENCOUNTER — Encounter: Payer: Self-pay | Admitting: Gastroenterology

## 2020-09-07 LAB — SURGICAL PATHOLOGY

## 2020-09-08 ENCOUNTER — Encounter: Payer: Self-pay | Admitting: Gastroenterology

## 2020-10-27 ENCOUNTER — Ambulatory Visit: Payer: BC Managed Care – PPO | Admitting: Family Medicine

## 2020-10-30 ENCOUNTER — Other Ambulatory Visit: Payer: Self-pay | Admitting: Family Medicine

## 2020-10-30 DIAGNOSIS — Z1231 Encounter for screening mammogram for malignant neoplasm of breast: Secondary | ICD-10-CM

## 2020-11-01 ENCOUNTER — Encounter: Payer: Self-pay | Admitting: Family Medicine

## 2020-11-01 ENCOUNTER — Telehealth (INDEPENDENT_AMBULATORY_CARE_PROVIDER_SITE_OTHER): Payer: BC Managed Care – PPO | Admitting: Family Medicine

## 2020-11-01 DIAGNOSIS — F419 Anxiety disorder, unspecified: Secondary | ICD-10-CM | POA: Diagnosis not present

## 2020-11-01 DIAGNOSIS — K219 Gastro-esophageal reflux disease without esophagitis: Secondary | ICD-10-CM

## 2020-11-01 DIAGNOSIS — F325 Major depressive disorder, single episode, in full remission: Secondary | ICD-10-CM

## 2020-11-01 MED ORDER — SERTRALINE HCL 50 MG PO TABS: 50.0000 mg | ORAL_TABLET | Freq: Every day | ORAL | 0 refills | Status: DC

## 2020-11-01 NOTE — Progress Notes (Signed)
Name: Gwendolyn Hull   MRN: [id number]    DOB: [birth year]   Date:11/01/2020       Progress Note  Subjective  Chief Complaint  Follow Up  I connected with  Gwendolyn Hull  on 11/01/20 at  3:40 PM EDT by a video enabled telemedicine application and verified that I am speaking with the correct person using two identifiers.  I discussed the limitations of evaluation and management by telemedicine and the availability of in person appointments. The patient expressed understanding and agreed to proceed with the virtual visit  Staff also discussed with the patient that there may be a patient responsible charge related to this service. Patient Location: at home  Provider Location: [facility]  Additional Individuals present: alone   HPI  Major Depression: she has a long history of depression. She came in early March 2019 with worsening of symptoms. Phq9 was 12 but has been zero for a while now.  Seh was on Zoloft and Wellbutrin, but was able to wean off and currently down to 50 mg of Zoloft daily. She went through a lot of big events in her life. Separated from her husband in 2020, now divorced, two of her children stayed in [place] and youngest moved in with her durign the process. Change jobs twice and lost her father this Summer to colon cancer. 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 Discussed ways to prevent or minimize seasonal affective disorder.    GERD: no heartburn on indigestion   Patient Active Problem List   Diagnosis Date Noted   Family history of breast cancer 03/22/2019   Major depression, recurrent, chronic ([place]) 04/04/2017   Anxiety 04/04/2017   Seasonal allergic rhinitis 04/04/2017    Past Surgical History:  Procedure Laterality Date   CESAREAN SECTION  01/1998, 10/1999, 04/2002   COLONOSCOPY WITH PROPOFOL N/A 09/06/2020   Procedure: COLONOSCOPY WITH PROPOFOL;  Surgeon: Virgel Manifold, MD;   Location: [facility] ENDOSCOPY;  Service: Endoscopy;  Laterality: N/A;    Family History  Problem Relation Age of Onset   Breast cancer Mother 43   Cancer Mother        breast cancer 25 (age 74), tongue cancer 2011   Diabetes Mother    Colon cancer Maternal Grandmother    Depression Brother    Diabetes Father    Hearing loss Father    Heart disease Maternal Grandfather    Cancer Paternal Grandfather    Diabetes Paternal Grandfather     Social History   Socioeconomic History   Marital status: Legally Separated    Spouse name: Rodman Key   Number of children: 3   Years of education: Not on file   Highest education level: Bachelor's degree (e.g., BA, AB, BS)  Occupational History   Occupation: Pharmacist, hospital   Tobacco Use   Smoking status: Never   Smokeless tobacco: Never  Vaping Use   Vaping Use: Never used  Substance and Sexual Activity   Alcohol use: Yes    Comment: occasional drinks, no more than 3 in an evening   Drug use: No   Sexual activity: Yes    Partners: Male    Birth control/protection: I.U.D.  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    Two older sons in college in [place] Strain: Low Risk    Difficulty of Paying Living Expenses: Not hard at all  Food Insecurity: No Food Insecurity   Worried About Charity fundraiser in the Last Year: Never true   Arboriculturist in the Last Year: Never true  Transportation Needs: No Transportation Needs   Lack of Transportation (Medical): No   Lack of Transportation (Non-Medical): No  Physical Activity: Insufficiently Active   Days of Exercise per Week: 2 days    Minutes of Exercise per Session: 40 min  Stress: No Stress Concern Present   Feeling of Stress : Not at all  Social Connections: Moderately Isolated   Frequency of Communication with Friends and Family: More than three times a week   Frequency of Social Gatherings with Friends and Family: More than three times a week   Attends Religious Services: Never   Marine scientist or Organizations: Yes   Attends Music therapist: More than 4 times per year   Marital Status: Separated  Intimate Partner Violence: Not At Risk   Fear of Current or Ex-Partner: No   Emotionally Abused: No   Physically Abused: No   Sexually Abused: No     Current Outpatient Medications:    sertraline (ZOLOFT) 50 MG tablet, Take 1.5 tablets (75 mg total) by mouth daily. TAKE 1 TABLET BY MOUTH EVERY DAY, Disp: 135 tablet, Rfl: 1   sertraline (ZOLOFT) 100 MG tablet, Take 100 mg by mouth daily. (Patient not taking: Reported on 11/01/2020), Disp: , Rfl:   No Known Allergies  I personally reviewed active problem list, medication list, allergies, family history, social history, health maintenance with the patient/caregiver today.   ROS  Ten systems reviewed and is negative except as mentioned in HPI   Objective  Virtual encounter, vitals not obtained.  There is no height or weight on file to calculate BMI.  Physical Exam  Awake, alert and oriented   PHQ2/9: Depression screen [facility] 2/9 11/01/2020 05/10/2020 04/25/2020 10/27/2019 05/11/2019  Decreased Interest 0 0 0 0 0  Down, Depressed, Hopeless 0 0 0 0 0  PHQ - 2 Score 0 0 0 0 0  Altered sleeping 0 0 0 1 0  Tired, decreased energy 0 0 0 0 0  Change in appetite 0 0 0 0 0  Feeling bad or failure about yourself  0 0 0 0 0  Trouble concentrating 0 0 0 0 0  Moving slowly or fidgety/restless 0 0 0 0 0  Suicidal thoughts 0 0 0 0 0  PHQ-9 Score 0 0 0 1 0  Difficult doing work/chores - Not difficult at all - Not difficult at all Not difficult at  all  Some recent data might be hidden   PHQ-2/9 Result is negative.    Fall Risk: Fall Risk  11/01/2020 05/10/2020 04/25/2020 10/27/2019 05/11/2019  Falls in the past year? 0 0 0 0 0  Number falls in past yr: 0 0 0 0 0  Injury with Fall? 0 0 0 0 0  Risk for fall due to : No Fall Risks - - - -  Follow up Falls prevention discussed  Falls evaluation completed - - Falls evaluation completed     Assessment & Plan  1. Major depression in remission (HCC)  - sertraline (ZOLOFT) 50 MG tablet; Take 1 tablet (50 mg total) by mouth daily. TAKE 1 TABLET BY MOUTH EVERY DAY  Dispense: 90 tablet; Refill: 0  2. Anxiety  - sertraline (ZOLOFT) 50 MG tablet; Take 1 tablet (50 mg total) by mouth daily. TAKE 1 TABLET BY MOUTH EVERY DAY  Dispense: 90 tablet; Refill: 0  3. Gastroesophageal reflux disease without esophagitis   Doing well, off medications  I discussed the assessment and treatment plan with the patient. The patient was provided an opportunity to ask questions and all were answered. The patient agreed with the plan and demonstrated an understanding of the instructions.  The patient was advised to call back or seek an in-person evaluation if the symptoms worsen or if the condition fails to improve as anticipated.  I provided 25  minutes of non-face-to-face time during this encounter.

## 2020-11-15 ENCOUNTER — Ambulatory Visit
Admission: RE | Admit: 2020-11-15 | Discharge: 2020-11-15 | Disposition: A | Discharge status: Home / Self Care | Payer: BC Managed Care – PPO | Source: Ambulatory Visit | Attending: Family Medicine | Admitting: Family Medicine

## 2020-11-15 ENCOUNTER — Other Ambulatory Visit: Payer: Self-pay

## 2020-11-15 DIAGNOSIS — Z1231 Encounter for screening mammogram for malignant neoplasm of breast: Secondary | ICD-10-CM | POA: Diagnosis present

## 2020-11-22 NOTE — Telephone Encounter (Signed)
[facility] rcvd/charged 05/11/19

## 2021-04-09 IMAGING — MG DIGITAL SCREENING BILAT W/ TOMO W/ CAD
8 series · 9 of 24 positions shown · non-contrast
Comparison: Previous exam(s).

CLINICAL DATA: Screening.

EXAM:
DIGITAL SCREENING BILATERAL MAMMOGRAM WITH TOMO AND CAD

[L CC synth-2D]
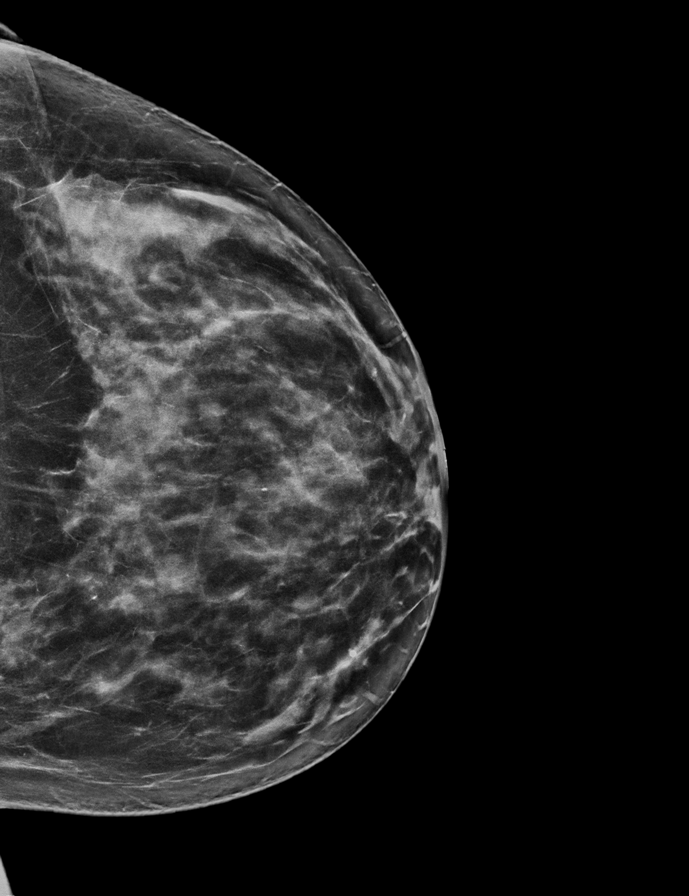

[R CC synth-2D]
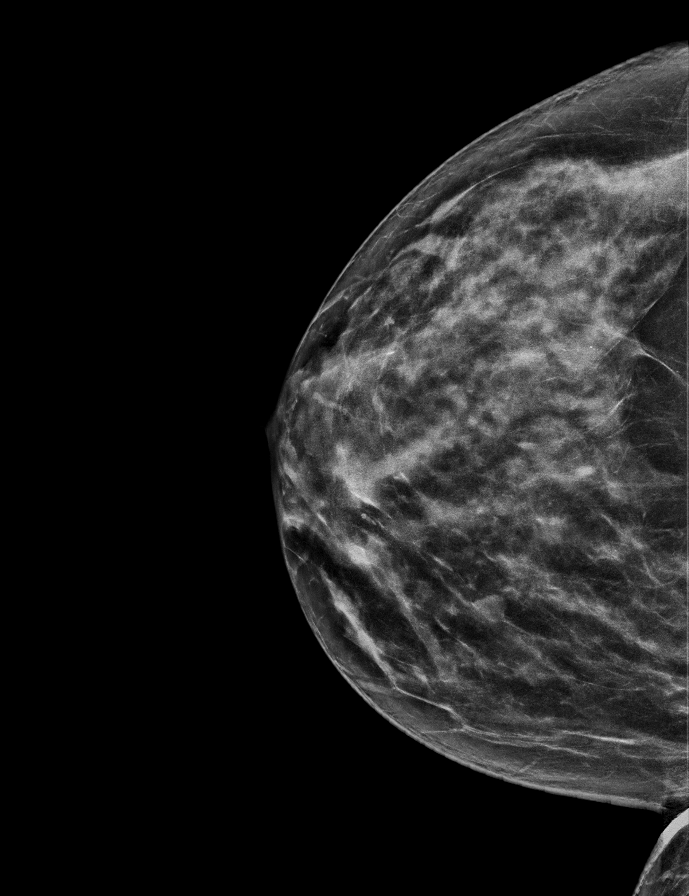

[R MLO synth-2D]
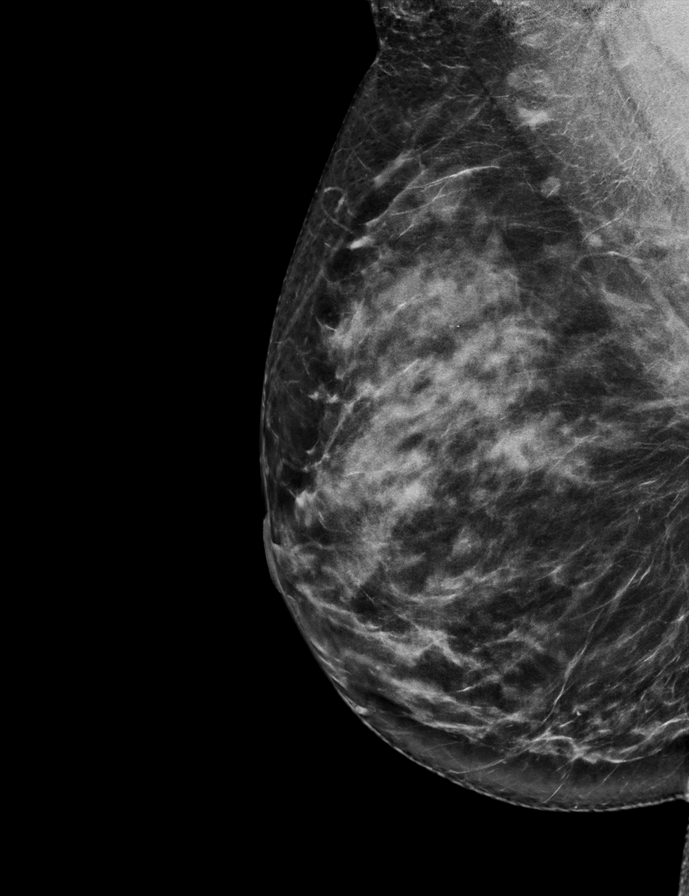

[L MLO synth-2D]
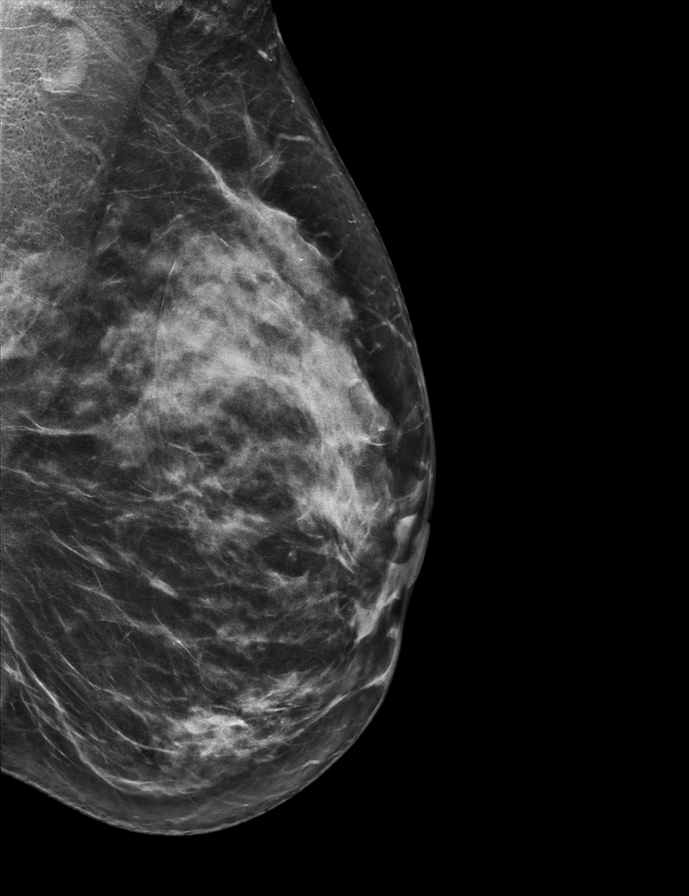

[L CC tomo · 2 of 71 frames shown]
[frame 23/71]
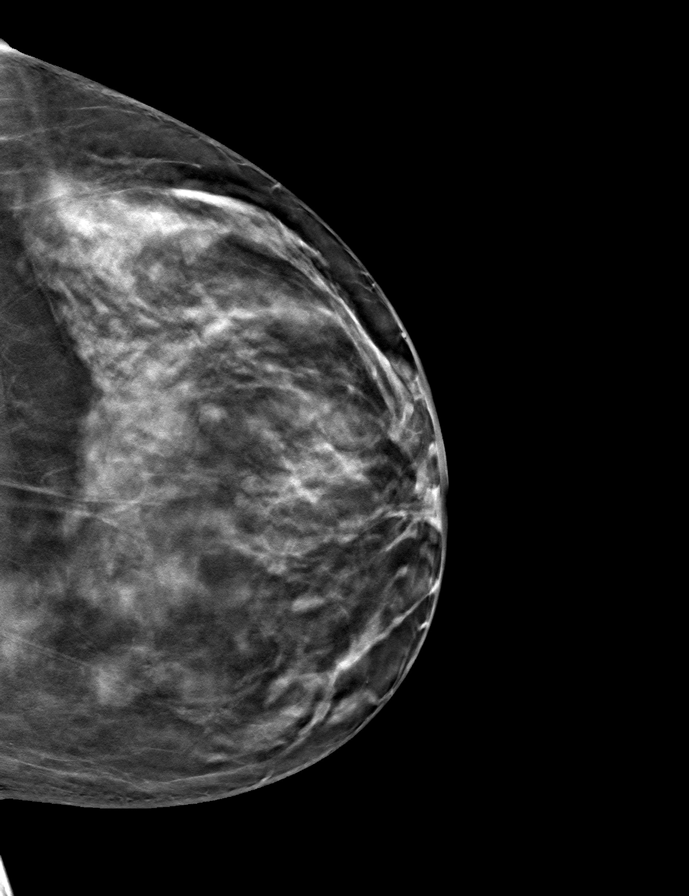
[frame 36/71]
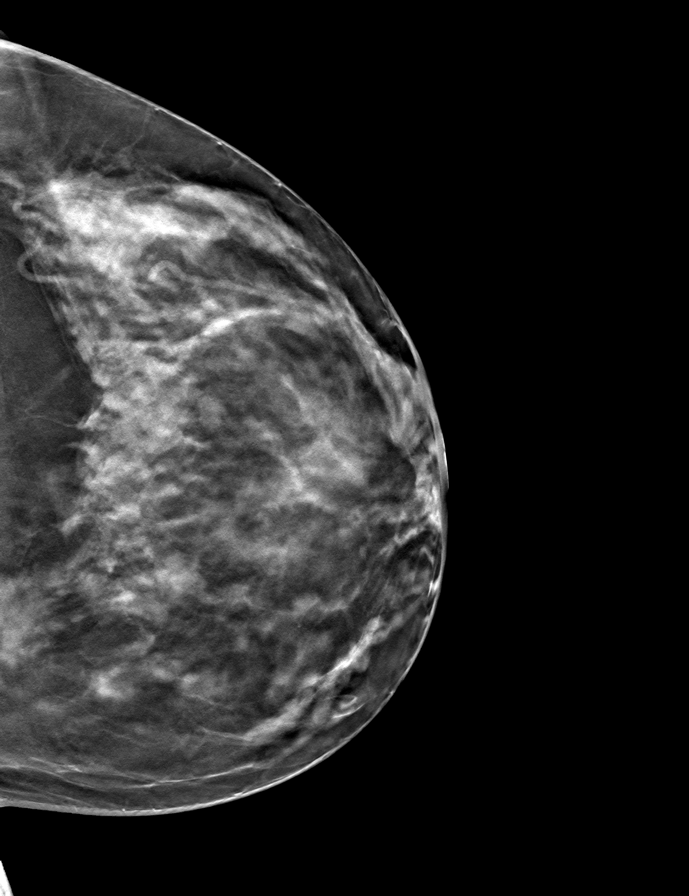

[R CC tomo · tomo slice 37/72.0]
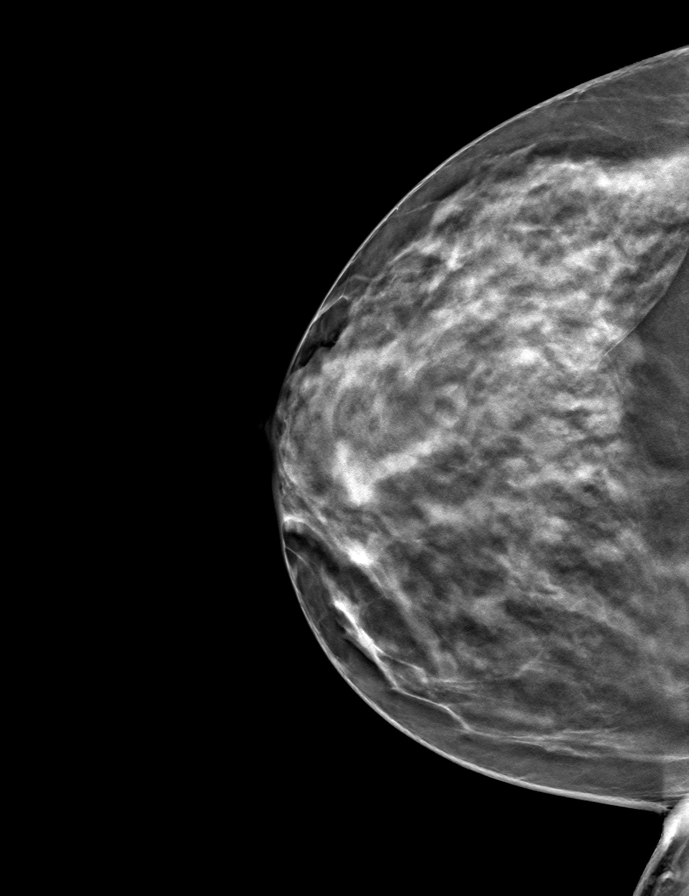

[L MLO tomo · tomo slice 36/71.0]
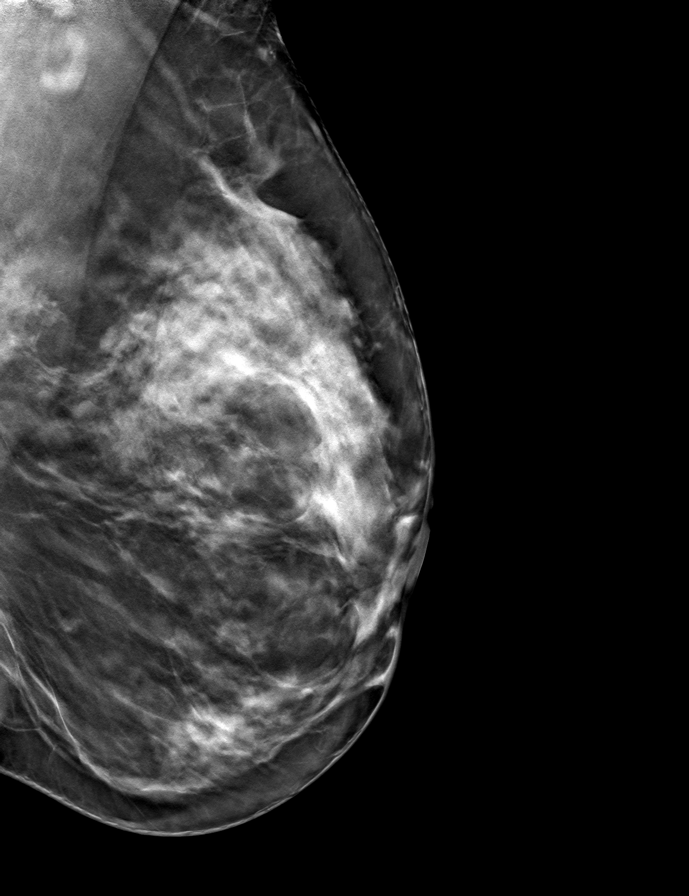

[R MLO tomo · tomo slice 37/72.0]
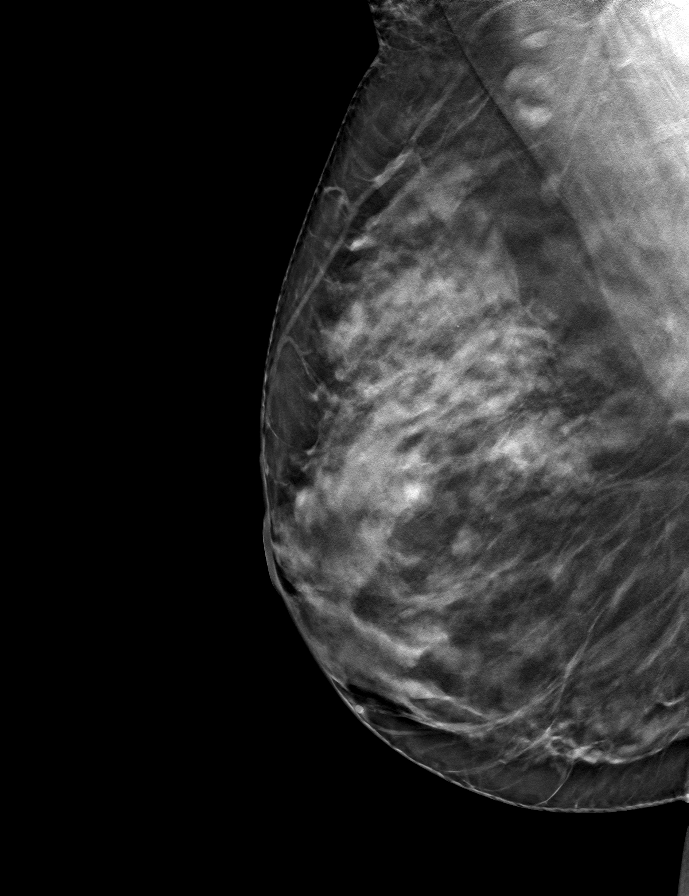

[9 of 24 positions shown; findings below may reference images not displayed]

ACR Breast Density Category c: The breast tissue is heterogeneously
dense, which may obscure small masses.
FINDINGS: There are no findings suspicious for malignancy. Images were
processed with CAD.
IMPRESSION: No mammographic evidence of malignancy. A result letter of this
screening mammogram will be mailed directly to the patient.

RECOMMENDATION:
Screening mammogram in one year. (Code:FT-U-LHB)

BI-RADS CATEGORY  1: Negative.

## 2021-05-11 NOTE — Progress Notes (Signed)
Name: Gwendolyn Hull   MRN: [id number]    DOB: 08-19-1975   Date:05/14/2021 ? ?     Progress Note ? ?Subjective ? ?Chief Complaint ? ?Annual Exam ? ?HPI ? ?Patient presents for annual CPE. ? ?Diet: balanced  ?Exercise: continue regular physical activity   ? ?Center Office Visit from 05/10/2020 in [facility]  ?AUDIT-C Score 0  ? ?  ? ?Depression: Phq 9 is  negative ? ?  05/14/2021  ?  3:08 PM 11/01/2020  ?  2:36 PM 05/10/2020  ?  8:02 AM 04/25/2020  ?  8:12 AM 10/27/2019  ?  7:49 AM  ?Depression screen PHQ 2/9  ?Decreased Interest 0 0 0 0 0  ?Down, Depressed, Hopeless 0 0 0 0 0  ?PHQ - 2 Score 0 0 0 0 0  ?Altered sleeping 0 0 0 0 1  ?Tired, decreased energy 0 0 0 0 0  ?Change in appetite 0 0 0 0 0  ?Feeling bad or failure about yourself  0 0 0 0 0  ?Trouble concentrating 0 0 0 0 0  ?Moving slowly or fidgety/restless 0 0 0 0 0  ?Suicidal thoughts 0 0 0 0 0  ?PHQ-9 Score 0 0 0 0 1  ?Difficult doing work/chores   Not difficult at all  Not difficult at all  ? ?Hypertension: ?BP Readings from Last 3 Encounters:  ?05/14/21 126/70  ?09/06/20 104/72  ?05/10/20 120/78  ? ?Obesity: ?Wt Readings from Last 3 Encounters:  ?05/14/21 139 lb (63 kg)  ?09/06/20 130 lb (59 kg)  ?05/10/20 136 lb 8 oz (61.9 kg)  ? ?BMI Readings from Last 3 Encounters:  ?05/14/21 24.62 kg/m?  ?09/06/20 23.03 kg/m?  ?05/10/20 24.18 kg/m?  ?  ? ?Vaccines:  ? ?HPV: N/A ?Tdap: up to date ?Shingrix: N/A ?Pneumonia: N/A ?Flu: up to date ?COVID-19: up to date ? ? ?Hep C Screening: 05/11/19 ?STD testing and prevention (HIV/chl/gon/syphilis): 05/11/19 ?Intimate partner violence: negative screen  ?Sexual History : same partner  ?Menstrual History/LMP/Abnormal Bleeding: regular but starting to get longer periods between cycles, discussed perimenopause  ?Discussed importance of follow up if any post-menopausal bleeding: not applicable  ?Incontinence Symptoms: seldom positive  for symptoms  ? ?Breast cancer:  ?- Last Mammogram: 11/15/20 ?- BRCA gene  screening: N/A ? ?Osteoporosis Prevention : Discussed high calcium and vitamin D supplementation, weight bearing exercises ?Bone density :not applicable  ? ?Cervical cancer screening: 10/10/17 ? ?Skin cancer: Discussed monitoring for atypical lesions  ?Colorectal cancer: 09/06/20   ?Lung cancer:  Low Dose CT Chest recommended if Age 7-80 years, 20 pack-year currently smoking OR have quit w/in 15years. Patient does not qualify for screen   ?ECG: N/A ? ?Advanced Care Planning: A voluntary discussion about advance care planning including the explanation and discussion of advance directives.  Discussed health care proxy and Living will, and the patient was able to identify a health care proxy as significant other.  Patient does have a living will and power of attorney of health care  ? ?Lipids: ?Lab Results  ?Component Value Date  ? CHOL 237 (H) 05/11/2019  ? CHOL 232 (H) 10/07/2017  ? ?Lab Results  ?Component Value Date  ? HDL 66 05/11/2019  ? HDL 63 10/07/2017  ? ?Lab Results  ?Component Value Date  ? LDLCALC 143 (H) 05/11/2019  ? LDLCALC 146 (H) 10/07/2017  ? ?Lab Results  ?Component Value Date  ? TRIG 151 (H) 05/11/2019  ? TRIG 115 10/07/2017  ? ?Lab  Results  ?Component Value Date  ? CHOLHDL 3.6 05/11/2019  ? CHOLHDL 3.7 10/07/2017  ? ?No results found for: LDLDIRECT ? ?Glucose: ?Glucose, Bld  ?Date Value Ref Range Status  ?05/11/2019 92 65 - 99 mg/dL Final  ?  Comment:  ?  . ?           Fasting reference interval ?. ?  ?10/07/2017 87 65 - 99 mg/dL Final  ?  Comment:  ?  . ?           Fasting reference interval ?. ?  ? ? ?Patient Active Problem List  ? Diagnosis Date Noted  ? Family history of breast cancer 03/22/2019  ? Major depression, recurrent, chronic ([place]) 04/04/2017  ? Anxiety 04/04/2017  ? Seasonal allergic rhinitis 04/04/2017  ? ? ?Past Surgical History:  ?Procedure Laterality Date  ? CESAREAN SECTION  01/1998, 10/1999, 04/2002  ? COLONOSCOPY WITH PROPOFOL N/A 09/06/2020  ? Procedure: COLONOSCOPY WITH  PROPOFOL;  Surgeon: Virgel Manifold, MD;  Location: [facility] ENDOSCOPY;  Service: Endoscopy;  Laterality: N/A;  ? ? ?Family History  ?Problem Relation Age of Onset  ? Breast cancer Mother 62  ? Cancer Mother   ?     breast cancer 71 (age 44), tongue cancer 2011  ? Diabetes Mother   ? Cancer Father 101  ?     colon cancer  ? Diabetes Father   ? Hearing loss Father   ? Depression Brother   ? Colon cancer Maternal Grandmother   ? Heart disease Maternal Grandfather   ? Cancer Paternal Grandfather   ? Diabetes Paternal Grandfather   ? ? ?Social History  ? ?Socioeconomic History  ? Marital status: Legally Separated  ?  Spouse name: Rodman Key  ? Number of children: 3  ? Years of education: Not on file  ? Highest education level: Bachelor's degree (e.g., BA, AB, BS)  ?Occupational History  ? Occupation: Pharmacist, hospital   ?Tobacco Use  ? Smoking status: Never  ? Smokeless tobacco: Never  ?Vaping Use  ? Vaping Use: Never used  ?Substance and Sexual Activity  ? Alcohol use: Yes  ?  Comment: occasional drinks, no more than 3 in an evening  ? Drug use: No  ? Sexual activity: Yes  ?  Partners: Male  ?  Birth control/protection: I.U.D.  ?Other Topics Concern  ? Not on file  ?Social History Narrative  ? Married for the past 20 years  ? Three sons, youngest is in [place]   ? She was teaching full time, but now only subbing and trying to go back to job at [employer]   ? Husband moved back home Nov 2019, but now they are separated she confessed that she was having an affair in [place] before she moved back.   ? Two older sons in college in [place]   ? ?Social Determinants of Health  ? ?Financial Resource Strain: Low Risk   ? Difficulty of Paying Living Expenses: Not hard at all  ?Food Insecurity: No Food Insecurity  ? Worried About Charity fundraiser in the Last Year: Never true  ? Ran Out of Food in the Last Year: Never true  ?Transportation Needs: No Transportation Needs  ? Lack of Transportation (Medical): No  ? Lack of Transportation  (Non-Medical): No  ?Physical Activity: Sufficiently Active  ? Days of Exercise per Week: 5 days  ? Minutes of Exercise per Session: 60 min  ?Stress: No Stress Concern Present  ? Feeling of Stress :  Only a little  ?Social Connections: Moderately Integrated  ? Frequency of Communication with Friends and Family: More than three times a week  ? Frequency of Social Gatherings with Friends and Family: More than three times a week  ? Attends Religious Services: Never  ? Active Member of Clubs or Organizations: Yes  ? Attends Archivist Meetings: More than 4 times per year  ? Marital Status: Living with partner  ?Intimate Partner Violence: Not At Risk  ? Fear of Current or Ex-Partner: No  ? Emotionally Abused: No  ? Physically Abused: No  ? Sexually Abused: No  ? ? ? ?Current Outpatient Medications:  ?  sertraline (ZOLOFT) 50 MG tablet, Take 1 tablet (50 mg total) by mouth daily. TAKE 1 TABLET BY MOUTH EVERY DAY, Disp: 90 tablet, Rfl: 0 ? ?No Known Allergies ? ? ?ROS ? ?Constitutional: Negative for fever or weight change.  ?Respiratory: Negative for cough and shortness of breath.   ?Cardiovascular: Negative for chest pain or palpitations.  ?Gastrointestinal: Negative for abdominal pain, no bowel changes.  ?Musculoskeletal: Negative for gait problem or joint swelling.  ?Skin: Negative for rash.  ?Neurological: Negative for dizziness or headache.  ?No other specific complaints in a complete review of systems (except as listed in HPI above).  ? ?Objective ? ?Vitals:  ? 05/14/21 1516  ?BP: 126/70  ?Pulse: 91  ?Resp: 16  ?SpO2: 98%  ?Weight: 139 lb (63 kg)  ?Height: '5\' 3"'  (1.6 m)  ? ? ?Body mass index is 24.62 kg/m?. ? ?Physical Exam ? ?Constitutional: Patient appears well-developed and well-nourished. No distress.  ?HENT: Head: Normocephalic and atraumatic. Ears: B TMs ok, no erythema or effusion; Nose: Not done  Mouth/Throat: not done  ?Eyes: Conjunctivae and EOM are normal. Pupils are equal, round, and reactive to  light. No scleral icterus.  ?Neck: Normal range of motion. Neck supple. No JVD present. No thyromegaly present.  ?Cardiovascular: Normal rate, regular rhythm and normal heart sounds.  No murmur heard. No BLE edema. ?Pulm

## 2021-05-11 NOTE — Patient Instructions (Signed)

## 2021-05-14 ENCOUNTER — Ambulatory Visit: Payer: Self-pay | Admitting: Family Medicine

## 2021-05-14 ENCOUNTER — Encounter: Payer: Self-pay | Admitting: Family Medicine

## 2021-05-14 VITALS — BP 126/70 | HR 91 | Resp 16 | Ht 63.0 in | Wt 139.0 lb

## 2021-05-14 DIAGNOSIS — Z124 Encounter for screening for malignant neoplasm of cervix: Secondary | ICD-10-CM

## 2021-05-14 DIAGNOSIS — Z0000 Encounter for general adult medical examination without abnormal findings: Secondary | ICD-10-CM

## 2021-05-14 DIAGNOSIS — N6322 Unspecified lump in the left breast, upper inner quadrant: Secondary | ICD-10-CM

## 2021-05-14 DIAGNOSIS — Z1231 Encounter for screening mammogram for malignant neoplasm of breast: Secondary | ICD-10-CM

## 2021-06-15 ENCOUNTER — Other Ambulatory Visit: Payer: Self-pay

## 2021-06-15 DIAGNOSIS — N6322 Unspecified lump in the left breast, upper inner quadrant: Secondary | ICD-10-CM

## 2021-07-09 ENCOUNTER — Encounter: Payer: Self-pay | Admitting: Family Medicine

## 2021-07-25 NOTE — Progress Notes (Signed)
Name: Gwendolyn Hull   MRN: [id number]    DOB: [birth year]   Date:07/26/2021       Progress Note  Subjective  Chief Complaint  Follow Up  HPI  Major Depression: she has a long history of depression. She came in March 2019 with worsening of symptoms. Phq9 was 12 but has been zero for a while now.  She was on Zoloft and Wellbutrin, but was able to wean off and currently down to 50 mg of Zoloft daily. She went through a lot of big events in her life. Separated from her husband in 2020, now divorced, two of her children stayed in [place] and youngest moved in with her durign the process. Change jobs twice and lost her father  Summer 20 to colon cancer. She is feeling well, moved in with her boyfriend. Currently back at [employer] for work, but managing to control her stress.     Patient Active Problem List   Diagnosis Date Noted   Family history of breast cancer 03/22/2019   Major depression, recurrent, chronic ([place]) 04/04/2017   Anxiety 04/04/2017   Seasonal allergic rhinitis 04/04/2017    Past Surgical History:  Procedure Laterality Date   CESAREAN SECTION  01/1998, 10/1999, 04/2002   COLONOSCOPY WITH PROPOFOL N/A 09/06/2020   Procedure: COLONOSCOPY WITH PROPOFOL;  Surgeon: Virgel Manifold, MD;  Location: [facility] ENDOSCOPY;  Service: Endoscopy;  Laterality: N/A;    Family History  Problem Relation Age of Onset   Breast cancer Mother 35   Cancer Mother        breast cancer 1 (age 87), tongue cancer 2011   Diabetes Mother    Cancer Father 7       colon cancer   Diabetes Father    Hearing loss Father    Depression Brother    Colon cancer Maternal Grandmother    Heart disease Maternal Grandfather    Cancer Paternal Grandfather    Diabetes Paternal Grandfather     Social History   Tobacco Use   Smoking status: Never   Smokeless tobacco: Never  Substance Use Topics   Alcohol use: Yes    Comment: occasional drinks, no more than 3 in an evening     Current Outpatient  Medications:    sertraline (ZOLOFT) 50 MG tablet, Take 1 tablet (50 mg total) by mouth daily. TAKE 1 TABLET BY MOUTH EVERY DAY, Disp: 90 tablet, Rfl: 0  No Known Allergies  I personally reviewed active problem list, medication list, allergies, family history, social history, health maintenance with the patient/caregiver today.   ROS   Ten systems reviewed and is negative except as mentioned in HPI   Objective  Vitals:   07/26/21 1025  BP: 116/74  Pulse: 86  Resp: 16  SpO2: 99%  Weight: 137 lb (62.1 kg)  Height: '5\' 3"'$  (1.6 m)    Body mass index is 24.27 kg/m.  Physical Exam  Constitutional: Patient appears well-developed and well-nourished.  No distress.  HEENT: head atraumatic, normocephalic, pupils equal and reactive to light, neck supple Cardiovascular: Normal rate, regular rhythm and normal heart sounds.  No murmur heard. No BLE edema. Pulmonary/Chest: Effort normal and breath sounds normal. No respiratory distress. Abdominal: Soft.  There is no tenderness. Psychiatric: Patient has a normal mood and affect. behavior is normal. Judgment and thought content normal.   PHQ2/9:    07/26/2021   10:18 AM 05/14/2021    3:08 PM 11/01/2020    2:36 PM 05/10/2020    8:02 AM  04/25/2020    8:12 AM  Depression screen PHQ 2/9  Decreased Interest 0 0 0 0 0  Down, Depressed, Hopeless 0 0 0 0 0  PHQ - 2 Score 0 0 0 0 0  Altered sleeping 0 0 0 0 0  Tired, decreased energy 0 0 0 0 0  Change in appetite 0 0 0 0 0  Feeling bad or failure about yourself  0 0 0 0 0  Trouble concentrating 0 0 0 0 0  Moving slowly or fidgety/restless 0 0 0 0 0  Suicidal thoughts 0 0 0 0 0  PHQ-9 Score 0 0 0 0 0  Difficult doing work/chores    Not difficult at all     phq 9 is negative   Fall Risk:    07/26/2021   10:18 AM 05/14/2021    3:08 PM 11/01/2020    2:36 PM 05/10/2020    8:02 AM 04/25/2020    8:12 AM  Fall Risk   Falls in the past year? 0 0 0 0 0  Number falls in past yr: 0 0 0 0 0   Injury with Fall? 0 0 0 0 0  Risk for fall due to : No Fall Risks No Fall Risks No Fall Risks    Follow up Falls prevention discussed Falls prevention discussed Falls prevention discussed Falls evaluation completed       Functional Status Survey: Is the patient deaf or have difficulty hearing?: No Does the patient have difficulty seeing, even when wearing glasses/contacts?: No Does the patient have difficulty concentrating, remembering, or making decisions?: No Does the patient have difficulty walking or climbing stairs?: No Does the patient have difficulty dressing or bathing?: No Does the patient have difficulty doing errands alone such as visiting a doctor's office or shopping?: No    Assessment & Plan  Problem List Items Addressed This Visit     Major depression in remission ([place]) - Primary    Doing well on current regiment       Relevant Medications   sertraline (ZOLOFT) 50 MG tablet   Anxiety   Relevant Medications   sertraline (ZOLOFT) 50 MG tablet

## 2021-07-26 ENCOUNTER — Ambulatory Visit (INDEPENDENT_AMBULATORY_CARE_PROVIDER_SITE_OTHER): Payer: Self-pay | Admitting: Family Medicine

## 2021-07-26 ENCOUNTER — Encounter: Payer: Self-pay | Admitting: Family Medicine

## 2021-07-26 VITALS — BP 116/74 | HR 86 | Resp 16 | Ht 63.0 in | Wt 137.0 lb

## 2021-07-26 DIAGNOSIS — F325 Major depressive disorder, single episode, in full remission: Secondary | ICD-10-CM

## 2021-07-26 DIAGNOSIS — F419 Anxiety disorder, unspecified: Secondary | ICD-10-CM

## 2021-07-26 MED ORDER — SERTRALINE HCL 50 MG PO TABS: 50.0000 mg | ORAL_TABLET | Freq: Every day | ORAL | 3 refills | Status: DC

## 2021-07-26 NOTE — Assessment & Plan Note (Signed)
Doing well on current regiment

## 2021-11-07 ENCOUNTER — Other Ambulatory Visit: Payer: Self-pay | Admitting: Family Medicine

## 2021-11-07 DIAGNOSIS — Z1231 Encounter for screening mammogram for malignant neoplasm of breast: Secondary | ICD-10-CM

## 2021-11-13 ENCOUNTER — Encounter: Payer: Self-pay | Admitting: Family Medicine

## 2021-12-06 ENCOUNTER — Ambulatory Visit
Admission: RE | Admit: 2021-12-06 | Discharge: 2021-12-06 | Disposition: A | Discharge status: Home / Self Care | Payer: BC Managed Care – PPO | Source: Ambulatory Visit | Attending: Family Medicine | Admitting: Family Medicine

## 2021-12-06 DIAGNOSIS — Z1231 Encounter for screening mammogram for malignant neoplasm of breast: Secondary | ICD-10-CM | POA: Diagnosis present

## 2022-05-15 NOTE — Progress Notes (Unsigned)
Name: Gwendolyn Hull   MRN: [id number]    DOB: 24-Apr-1975   Date:05/16/2022       Progress Note  Subjective  Chief Complaint  Annual Exam  HPI  Patient presents for annual CPE.  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   Last Eye Exam:  Last Dental Exam:   Flowsheet Row Office Visit from 05/14/2021 in [facility]  AUDIT-C Score 2      Depression: Phq 9 is  negative    05/16/2022    9:21 AM 07/26/2021   10:18 AM 05/14/2021    3:08 PM 11/01/2020    2:36 PM 05/10/2020    8:02 AM  Depression screen PHQ 2/9  Decreased Interest 0 0 0 0 0  Down, Depressed, Hopeless 0 0 0 0 0  PHQ - 2 Score 0 0 0 0 0  Altered sleeping 0 0 0 0 0  Tired, decreased energy 0 0 0 0 0  Change in appetite 0 0 0 0 0  Feeling bad or failure about yourself  0 0 0 0 0  Trouble concentrating 0 0 0 0 0  Moving slowly or fidgety/restless 0 0 0 0 0  Suicidal thoughts 0 0 0 0 0  PHQ-9 Score 0 0 0 0 0  Difficult doing work/chores     Not difficult at all   Hypertension: BP Readings from Last 3 Encounters:  05/16/22 128/74  07/26/21 116/74  05/14/21 126/70   Obesity: Wt Readings from Last 3 Encounters:  05/16/22 140 lb (63.5 kg)  07/26/21 137 lb (62.1 kg)  05/14/21 139 lb (63 kg)   BMI Readings from Last 3 Encounters:  05/16/22 24.80 kg/m  07/26/21 24.27 kg/m  05/14/21 24.62 kg/m     Vaccines:   Tdap: up to date Shingrix: N/A Pneumonia: N/A Flu: up to date COVID-19: up to date   Hep C Screening: 05/11/19 STD testing and prevention (HIV/chl/gon/syphilis): 05/11/19 - not interested on STI screen now  Intimate partner violence: negative screen  Sexual History :dating, same partner Menstrual History/LMP/Abnormal Bleeding: cycles are becoming slightly irregular , longer gaps in between - she has an IUD Discussed importance of follow up if any post-menopausal bleeding: N/A  Incontinence Symptoms:  negative for symptoms   Breast cancer:  - Last Mammogram: 12/06/21 - BRCA gene screening: N/A  Osteoporosis Prevention : Discussed high calcium and vitamin D supplementation, weight bearing exercises Bone density: N/A   Cervical cancer screening: 10/09/17, due in Sept- ordered today  Skin cancer: Discussed monitoring for atypical lesions - she will schedule follow up with dermatologist  Colorectal cancer: 09/06/20   Lung cancer:  Low Dose CT Chest recommended if Age 28-80 years, 20 pack-year currently smoking OR have quit w/in 15years. Patient does not qualify for screen   ECG: N/A  Advanced Care Planning: A voluntary discussion about advance care planning including the explanation and discussion of advance directives.  Discussed health care proxy and Living will, and the patient was able to identify a health care proxy as Shirlee Latch.  Patient does not have a living will and power of attorney of health care   Lipids: Lab Results  Component Value Date   CHOL 237 (H) 05/11/2019   CHOL 232 (H) 10/07/2017   Lab Results  Component Value Date   HDL 66 05/11/2019   HDL 63 10/07/2017   Lab Results  Component Value Date  LDLCALC 143 (H) 05/11/2019   LDLCALC 146 (H) 10/07/2017   Lab Results  Component Value Date   TRIG 151 (H) 05/11/2019   TRIG 115 10/07/2017   Lab Results  Component Value Date   CHOLHDL 3.6 05/11/2019   CHOLHDL 3.7 10/07/2017   No results found for: "LDLDIRECT"  Glucose: Glucose, Bld  Date Value Ref Range Status  05/11/2019 92 65 - 99 mg/dL Final    Comment:    .            Fasting reference interval .   10/07/2017 87 65 - 99 mg/dL Final    Comment:    .            Fasting reference interval .     Patient Active Problem List   Diagnosis Date Noted   Major depression in remission 07/26/2021   Family history of breast cancer 03/22/2019   Anxiety 04/04/2017   Seasonal allergic rhinitis 04/04/2017    Past Surgical History:  Procedure  Laterality Date   CESAREAN SECTION  01/1998, 10/1999, 04/2002   COLONOSCOPY WITH PROPOFOL N/A 09/06/2020   Procedure: COLONOSCOPY WITH PROPOFOL;  Surgeon: Pasty Spillers, MD;  Location: [facility] ENDOSCOPY;  Service: Endoscopy;  Laterality: N/A;    Family History  Problem Relation Age of Onset   Breast cancer Mother 48   Cancer Mother        breast cancer 45 (age 67), tongue cancer 2011   Diabetes Mother    Cancer Father 57       colon cancer   Diabetes Father    Hearing loss Father    Depression Brother    Colon cancer Maternal Grandmother    Heart disease Maternal Grandfather    Cancer Paternal Grandfather    Diabetes Paternal Grandfather     Social History   Socioeconomic History   Marital status: Divorced    Spouse name: Molli Hazard   Number of children: 3   Years of education: Not on file   Highest education level: Bachelor's degree (e.g., BA, AB, BS)  Occupational History   Occupation: Runner, broadcasting/film/video   Tobacco Use   Smoking status: Never   Smokeless tobacco: Never  Vaping Use   Vaping Use: Never used  Substance and Sexual Activity   Alcohol use: Yes    Comment: occasional drinks, no more than 3 in an evening   Drug use: No   Sexual activity: Yes    Partners: Male    Birth control/protection: I.U.D.  Other Topics Concern   Not on file  Social History Narrative   Married for the past 20 years   Three sons, youngest is in HS    She was teaching full time, but now only subbing and trying to go back to job at [employer]    Husband moved back home Nov 2019, but now they are separated she confessed that she was having an affair in [place] before she moved back.    Two older sons in college in [place]    Social Determinants of Health   Financial Resource Strain: Low Risk  (05/16/2022)   Overall Financial Resource Strain (CARDIA)    Difficulty of Paying Living Expenses: Not hard at all  Food Insecurity: No Food Insecurity (05/16/2022)   Hunger Vital Sign    Worried About  Running Out of Food in the Last Year: Never true    Ran Out of Food in the Last Year: Never true  Transportation Needs: No Transportation Needs (05/16/2022)  PRAPARE - Administrator, Civil Service (Medical): No    Lack of Transportation (Non-Medical): No  Physical Activity: Inactive (05/16/2022)   Exercise Vital Sign    Days of Exercise per Week: 0 days    Minutes of Exercise per Session: 0 min  Stress: No Stress Concern Present (05/16/2022)   [employer] of Occupational Health - Occupational Stress Questionnaire    Feeling of Stress : Only a little  Social Connections: Moderately Integrated (05/16/2022)   Social Connection and Isolation Panel [NHANES]    Frequency of Communication with Friends and Family: More than three times a week    Frequency of Social Gatherings with Friends and Family: More than three times a week    Attends Religious Services: Never    Database administrator or Organizations: Yes    Attends Engineer, structural: More than 4 times per year    Marital Status: Living with partner  Intimate Partner Violence: Not At Risk (05/16/2022)   Humiliation, Afraid, Rape, and Kick questionnaire    Fear of Current or Ex-Partner: No    Emotionally Abused: No    Physically Abused: No    Sexually Abused: No     Current Outpatient Medications:    sertraline (ZOLOFT) 50 MG tablet, Take 1 tablet (50 mg total) by mouth daily., Disp: 90 tablet, Rfl: 3  No Known Allergies   ROS  Constitutional: Negative for fever or weight change.  Respiratory: Negative for cough and shortness of breath.   Cardiovascular: Negative for chest pain or palpitations.  Gastrointestinal: Negative for abdominal pain, no bowel changes.  Musculoskeletal: Negative for gait problem or joint swelling.  Skin: Negative for rash.  Neurological: Negative for dizziness or headache.  No other specific complaints in a complete review of systems (except as listed in HPI above).    Objective  Vitals:   05/16/22 0927  BP: 128/74  Pulse: 95  Resp: 16  SpO2: 98%  Weight: 140 lb (63.5 kg)  Height:  (1.6 m)    Body mass index is 24.8 kg/m.  Physical Exam  Constitutional: Patient appears well-developed and well-nourished. No distress.  HENT: Head: Normocephalic and atraumatic. Ears: B TMs ok, no erythema or effusion; Nose: Nose normal. Mouth/Throat: Oropharynx is clear and moist. No oropharyngeal exudate.  Eyes: Conjunctivae and EOM are normal. Pupils are equal, round, and reactive to light. No scleral icterus.  Neck: Normal range of motion. Neck supple. No JVD present. No thyromegaly present.  Cardiovascular: Normal rate, regular rhythm and normal heart sounds.  No murmur heard. No BLE edema. Pulmonary/Chest: Effort normal and breath sounds normal. No respiratory distress. Abdominal: Soft. Bowel sounds are normal, no distension. There is no tenderness. no masses Breast: no lumps or masses, no nipple discharge or rashes FEMALE GENITALIA:  External genitalia normal External urethra normal Vaginal vault normal without discharge or lesions Cervix normal without discharge or lesions, IUD in place  Bimanual exam normal without masses RECTAL: not done  Musculoskeletal: Normal range of motion, no joint effusions. No gross deformities Neurological: he is alert and oriented to person, place, and time. No cranial nerve deficit. Coordination, balance, strength, speech and gait are normal.  Skin: Skin is warm and dry. No rash noted. No erythema. Some moles on her back  Psychiatric: Patient has a normal mood and affect. behavior is normal. Judgment and thought content normal.   Fall Risk:    05/16/2022    9:21 AM 07/26/2021   10:18 AM  05/14/2021    3:08 PM 11/01/2020    2:36 PM 05/10/2020    8:02 AM  Fall Risk   Falls in the past year? 0 0 0 0 0  Number falls in past yr: 0 0 0 0 0  Injury with Fall? 0 0 0 0 0  Risk for fall due to : No Fall Risks No Fall Risks  No Fall Risks No Fall Risks   Follow up Falls prevention discussed Falls prevention discussed Falls prevention discussed Falls prevention discussed Falls evaluation completed     Functional Status Survey: Is the patient deaf or have difficulty hearing?: No Does the patient have difficulty seeing, even when wearing glasses/contacts?: No Does the patient have difficulty concentrating, remembering, or making decisions?: No Does the patient have difficulty walking or climbing stairs?: No Does the patient have difficulty dressing or bathing?: No Does the patient have difficulty doing errands alone such as visiting a doctor's office or shopping?: No   Assessment & Plan  1. Well adult exam  - Cytology - PAP - TSH - B12 and Folate Panel - Vitamin D (25 hydroxy) - Lipid panel - CBC with Differential/Platelet - COMPLETE METABOLIC PANEL WITH GFR - Hemoglobin A1c  2. Cervical cancer screening  - Cytology - PAP  3. Breast cancer screening by mammogram  - MM 3D SCREENING MAMMOGRAM BILATERAL BREAST; Future  4. Diabetes mellitus screening  - Hemoglobin A1c  5. Dyslipidemia  - Lipid panel  6. Long-term use of high-risk medication  - TSH - B12 and Folate Panel - Vitamin D (25 hydroxy) - CBC with Differential/Platelet - COMPLETE METABOLIC PANEL WITH GFR  7. Screening for deficiency anemia  - Lipid panel  8. Major depression in remission  - TSH - B12 and Folate Panel   -USPSTF grade A and B recommendations reviewed with patient; age-appropriate recommendations, preventive care, screening tests, etc discussed and encouraged; healthy living encouraged; see AVS for patient education given to patient -Discussed importance of 150 minutes of physical activity weekly, eat two servings of fish weekly, eat one serving of tree nuts ( cashews, pistachios, pecans, almonds.Marland Kitchen) every other day, eat 6 servings of fruit/vegetables daily and drink plenty of water and avoid sweet beverages.    -Reviewed Health Maintenance: Yes.

## 2022-05-15 NOTE — Patient Instructions (Signed)
Preventive Care 40-49 Years Old, Female Preventive care refers to lifestyle choices and visits with your health care provider that can promote health and wellness. Preventive care visits are also called wellness exams. What can I expect for my preventive care visit? Counseling Your health care provider may ask you questions about your: Medical history, including: Past medical problems. Family medical history. Pregnancy history. Current health, including: Menstrual cycle. Method of birth control. Emotional well-being. Home life and relationship well-being. Sexual activity and sexual health. Lifestyle, including: Alcohol, nicotine or tobacco, and drug use. Access to firearms. Diet, exercise, and sleep habits. Work and work environment. Sunscreen use. Safety issues such as seatbelt and bike helmet use. Physical exam Your health care provider will check your: Height and weight. These may be used to calculate your BMI (body mass index). BMI is a measurement that tells if you are at a healthy weight. Waist circumference. This measures the distance around your waistline. This measurement also tells if you are at a healthy weight and may help predict your risk of certain diseases, such as type 2 diabetes and high blood pressure. Heart rate and blood pressure. Body temperature. Skin for abnormal spots. What immunizations do I need?  Vaccines are usually given at various ages, according to a schedule. Your health care provider will recommend vaccines for you based on your age, medical history, and lifestyle or other factors, such as travel or where you work. What tests do I need? Screening Your health care provider may recommend screening tests for certain conditions. This may include: Lipid and cholesterol levels. Diabetes screening. This is done by checking your blood sugar (glucose) after you have not eaten for a while (fasting). Pelvic exam and Pap test. Hepatitis B test. Hepatitis C  test. HIV (human immunodeficiency virus) test. STI (sexually transmitted infection) testing, if you are at risk. Lung cancer screening. Colorectal cancer screening. Mammogram. Talk with your health care provider about when you should start having regular mammograms. This may depend on whether you have a family history of breast cancer. BRCA-related cancer screening. This may be done if you have a family history of breast, ovarian, tubal, or peritoneal cancers. Bone density scan. This is done to screen for osteoporosis. Talk with your health care provider about your test results, treatment options, and if necessary, the need for more tests. Follow these instructions at home: Eating and drinking  Eat a diet that includes fresh fruits and vegetables, whole grains, lean protein, and low-fat dairy products. Take vitamin and mineral supplements as recommended by your health care provider. Do not drink alcohol if: Your health care provider tells you not to drink. You are pregnant, may be pregnant, or are planning to become pregnant. If you drink alcohol: Limit how much you have to 0-1 drink a day. Know how much alcohol is in your drink. In the [place] one drink equals one 12 oz bottle of beer (355 mL), one 5 oz glass of wine (148 mL), or one 1 oz glass of hard liquor (44 mL). Lifestyle Brush your teeth every morning and night with fluoride toothpaste. Floss one time each day. Exercise for at least 30 minutes 5 or more days each week. Do not use any products that contain nicotine or tobacco. These products include cigarettes, chewing tobacco, and vaping devices, such as e-cigarettes. If you need help quitting, ask your health care provider. Do not use drugs. If you are sexually active, practice safe sex. Use a condom or other form of protection to   prevent STIs. If you do not wish to become pregnant, use a form of birth control. If you plan to become pregnant, see your health care provider for a  prepregnancy visit. Take aspirin only as told by your health care provider. Make sure that you understand how much to take and what form to take. Work with your health care provider to find out whether it is safe and beneficial for you to take aspirin daily. Find healthy ways to manage stress, such as: Meditation, yoga, or listening to music. Journaling. Talking to a trusted person. Spending time with friends and family. Minimize exposure to UV radiation to reduce your risk of skin cancer. Safety Always wear your seat belt while driving or riding in a vehicle. Do not drive: If you have been drinking alcohol. Do not ride with someone who has been drinking. When you are tired or distracted. While texting. If you have been using any mind-altering substances or drugs. Wear a helmet and other protective equipment during sports activities. If you have firearms in your house, make sure you follow all gun safety procedures. Seek help if you have been physically or sexually abused. What's next? Visit your health care provider once a year for an annual wellness visit. Ask your health care provider how often you should have your eyes and teeth checked. Stay up to date on all vaccines. This information is not intended to replace advice given to you by your health care provider. Make sure you discuss any questions you have with your health care provider. Document Revised: 07/12/2020 Document Reviewed: 07/12/2020 Elsevier Patient Education  2023 Elsevier Inc.  

## 2022-05-16 ENCOUNTER — Encounter: Payer: Self-pay | Admitting: Family Medicine

## 2022-05-16 ENCOUNTER — Ambulatory Visit (INDEPENDENT_AMBULATORY_CARE_PROVIDER_SITE_OTHER): Payer: No Typology Code available for payment source | Admitting: Family Medicine

## 2022-05-16 ENCOUNTER — Other Ambulatory Visit (HOSPITAL_COMMUNITY)
Admission: RE | Admit: 2022-05-16 | Discharge: 2022-05-16 | Disposition: A | Discharge status: Home / Self Care | Payer: No Typology Code available for payment source | Source: Ambulatory Visit | Attending: Family Medicine | Admitting: Family Medicine

## 2022-05-16 VITALS — BP 128/74 | HR 95 | Resp 16 | Ht 63.0 in | Wt 140.0 lb

## 2022-05-16 DIAGNOSIS — E785 Hyperlipidemia, unspecified: Secondary | ICD-10-CM

## 2022-05-16 DIAGNOSIS — Z124 Encounter for screening for malignant neoplasm of cervix: Secondary | ICD-10-CM | POA: Diagnosis present

## 2022-05-16 DIAGNOSIS — Z131 Encounter for screening for diabetes mellitus: Secondary | ICD-10-CM

## 2022-05-16 DIAGNOSIS — Z1231 Encounter for screening mammogram for malignant neoplasm of breast: Secondary | ICD-10-CM | POA: Diagnosis not present

## 2022-05-16 DIAGNOSIS — Z79899 Other long term (current) drug therapy: Secondary | ICD-10-CM

## 2022-05-16 DIAGNOSIS — Z0000 Encounter for general adult medical examination without abnormal findings: Secondary | ICD-10-CM | POA: Insufficient documentation

## 2022-05-16 DIAGNOSIS — F325 Major depressive disorder, single episode, in full remission: Secondary | ICD-10-CM

## 2022-05-16 DIAGNOSIS — Z130 Encounter for screening for diseases of the blood and blood-forming organs and certain disorders involving the immune mechanism: Secondary | ICD-10-CM

## 2022-05-17 LAB — COMPLETE METABOLIC PANEL WITH GFR
AG Ratio: 2 (calc) (ref 1.0–2.5)
ALT: 13 U/L (ref 6–29)
AST: 12 U/L (ref 10–35)
Albumin: 4.5 g/dL (ref 3.6–5.1)
Alkaline phosphatase (APISO): 74 U/L (ref 31–125)
BUN: 11 mg/dL (ref 7–25)
CO2: 30 mmol/L (ref 20–32)
Calcium: 10 mg/dL (ref 8.6–10.2)
Chloride: 103 mmol/L (ref 98–110)
Creat: 0.62 mg/dL (ref 0.50–0.99)
Globulin: 2.3 g/dL (calc) (ref 1.9–3.7)
Glucose, Bld: 93 mg/dL (ref 65–99)
Potassium: 4.3 mmol/L (ref 3.5–5.3)
Sodium: 139 mmol/L (ref 135–146)
Total Bilirubin: 0.7 mg/dL (ref 0.2–1.2)
Total Protein: 6.8 g/dL (ref 6.1–8.1)
eGFR: 110 mL/min/{1.73_m2} (ref >=60)

## 2022-05-17 LAB — CBC WITH DIFFERENTIAL/PLATELET
Absolute Monocytes: 541 cells/uL (ref 200–950)
Basophils Absolute: 53 cells/uL (ref 0–200)
Basophils Relative: 0.8 %
Eosinophils Absolute: 59 cells/uL (ref 15–500)
Eosinophils Relative: 0.9 %
HCT: 40.3 % (ref 35.0–45.0)
Hemoglobin: 13.7 g/dL (ref 11.7–15.5)
Lymphs Abs: 2132 cells/uL (ref 850–3900)
MCH: 31.1 pg (ref 27.0–33.0)
MCHC: 34 g/dL (ref 32.0–36.0)
MCV: 91.6 fL (ref 80.0–100.0)
MPV: 10 fL (ref 7.5–12.5)
Monocytes Relative: 8.2 %
Neutro Abs: 3815 cells/uL (ref 1500–7800)
Neutrophils Relative %: 57.8 %
Platelets: 314 10*3/uL (ref 140–400)
RBC: 4.4 10*6/uL (ref 3.80–5.10)
RDW: 11.3 % (ref 11.0–15.0)
Total Lymphocyte: 32.3 %
WBC: 6.6 10*3/uL (ref 3.8–10.8)

## 2022-05-17 LAB — LIPID PANEL
Cholesterol: 236 mg/dL — ABNORMAL HIGH (ref <200)
HDL: 56 mg/dL (ref >=50)
LDL Cholesterol (Calc): 147 mg/dL (calc) — ABNORMAL HIGH
Non-HDL Cholesterol (Calc): 180 mg/dL (calc) — ABNORMAL HIGH (ref <130)
Total CHOL/HDL Ratio: 4.2 (calc) (ref <5.0)
Triglycerides: 191 mg/dL — ABNORMAL HIGH (ref <150)

## 2022-05-17 LAB — B12 AND FOLATE PANEL
Folate: >24 ng/mL
Vitamin B-12: 202 pg/mL (ref 200–1100)

## 2022-05-17 LAB — VITAMIN D 25 HYDROXY (VIT D DEFICIENCY, FRACTURES): Vit D, 25-Hydroxy: 26 ng/mL — ABNORMAL LOW (ref 30–100)

## 2022-05-17 LAB — HEMOGLOBIN A1C
Hgb A1c MFr Bld: 5.5 % of total Hgb (ref <5.7)
Mean Plasma Glucose: 111 mg/dL
eAG (mmol/L): 6.2 mmol/L

## 2022-05-17 LAB — TSH: TSH: 1.57 mIU/L

## 2022-05-21 LAB — CYTOLOGY - PAP
Comment: NEGATIVE
Diagnosis: NEGATIVE
High risk HPV: NEGATIVE

## 2022-07-15 ENCOUNTER — Ambulatory Visit: Payer: No Typology Code available for payment source | Admitting: Family Medicine

## 2022-07-29 ENCOUNTER — Ambulatory Visit: Payer: Self-pay | Admitting: Family Medicine

## 2022-08-05 ENCOUNTER — Ambulatory Visit: Payer: Self-pay | Admitting: Family Medicine

## 2022-08-29 ENCOUNTER — Other Ambulatory Visit: Payer: Self-pay | Admitting: Family Medicine

## 2022-08-29 DIAGNOSIS — F419 Anxiety disorder, unspecified: Secondary | ICD-10-CM

## 2022-08-29 DIAGNOSIS — F325 Major depressive disorder, single episode, in full remission: Secondary | ICD-10-CM

## 2022-09-05 NOTE — Progress Notes (Signed)
Name: Gwendolyn Hull   MRN: [id number]    DOB: [birth year]   Date:09/06/2022       Progress Note  Subjective  Chief Complaint  Follow Up  HPI  Major Depression: she has a long history of depression. She came in March 2019 with worsening of symptoms. Phq9 was 12 but has been zero for a while now.  She was on Zoloft and Wellbutrin, but was able to wean off and currently down to 50 mg of Zoloft daily. She went through a lot of big events in her life. Separated from her husband in 2020, now divorced, two of her children stayed in [place] and youngest moved in with her durign the process. Change jobs twice and lost her father at the  Summer 20 to colon cancer. 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   B12 and Vitamin D deficiency: discussed lab results and importance of supplementation  Dyslipidemia: mother also has high cholesterol but denies family history of heart disease, discussed healthy diet and we will continue to monitor   Patient Active Problem List   Diagnosis Date Noted   Major depression in remission (HCC) 07/26/2021   Family history of breast cancer 03/22/2019   Anxiety 04/04/2017   Seasonal allergic rhinitis 04/04/2017    Past Surgical History:  Procedure Laterality Date   CESAREAN SECTION  01/1998, 10/1999, 04/2002   COLONOSCOPY WITH PROPOFOL N/A 09/06/2020   Procedure: COLONOSCOPY WITH PROPOFOL;  Surgeon: Pasty Spillers, MD;  Location: [facility] ENDOSCOPY;  Service: Endoscopy;  Laterality: N/A;    Family History  Problem Relation Age of Onset   Breast cancer Mother 64   Cancer Mother        breast cancer 18 (age 28), tongue cancer 2011   Diabetes Mother    Cancer Father 66       colon cancer   Diabetes Father    Hearing loss Father    Depression Brother    Colon cancer Maternal Grandmother    Heart disease  Maternal Grandfather    Cancer Paternal Grandfather    Diabetes Paternal Grandfather     Social History   Tobacco Use   Smoking status: Never   Smokeless tobacco: Never  Substance Use Topics   Alcohol use: Yes    Comment: occasional drinks, no more than 3 in an evening     Current Outpatient Medications:    sertraline (ZOLOFT) 50 MG tablet, TAKE 1 TABLET BY MOUTH EVERY DAY, Disp: 90 tablet, Rfl: 0  No Known Allergies  I personally reviewed active problem list, medication list, allergies, family history, social history, health maintenance with the patient/caregiver today.   ROS  Ten systems reviewed and is negative except as mentioned in HPI    Objective  Vitals:   09/06/22 1004  BP: 126/72  Pulse: 92  Resp: 16  SpO2: 98%  Weight: 139 lb (63 kg)  Height: 5\' 3"  (1.6 m)    Body mass index is 24.62 kg/m.  Physical Exam  Constitutional: Patient appears well-developed and well-nourished. Obese  No distress.  HEENT: head atraumatic, normocephalic, pupils equal and reactive to light, neck supple Cardiovascular: Normal rate, regular rhythm and normal heart sounds.  No murmur heard. No BLE edema. Pulmonary/Chest: Effort normal and breath sounds normal. No respiratory distress. Abdominal: Soft.  There is no tenderness. Psychiatric: Patient has a normal mood and affect. behavior is normal. Judgment and thought content normal.    PHQ2/9:    09/06/2022   10:04 AM 05/16/2022    9:21 AM 07/26/2021   10:18 AM 05/14/2021    3:08 PM 11/01/2020    2:36 PM  Depression screen PHQ 2/9  Decreased Interest 0 0 0 0 0  Down, Depressed, Hopeless 0 0 0 0 0  PHQ - 2 Score 0 0 0 0 0  Altered sleeping 0 0 0 0 0  Tired, decreased energy 0 0 0 0 0  Change in appetite 0 0 0 0 0  Feeling bad or failure about yourself  0 0 0 0 0  Trouble concentrating 0 0 0 0 0  Moving slowly or fidgety/restless 0 0 0 0 0  Suicidal thoughts 0 0 0 0 0  PHQ-9 Score 0 0 0 0 0    phq 9 is  negative   Fall Risk:    09/06/2022   10:03 AM 05/16/2022    9:21 AM 07/26/2021   10:18 AM 05/14/2021    3:08 PM 11/01/2020    2:36 PM  Fall Risk   Falls in the past year? 0 0 0 0 0  Number falls in past yr: 0 0 0 0 0  Injury with Fall? 0 0 0 0 0  Risk for fall due to : No Fall Risks No Fall Risks No Fall Risks No Fall Risks No Fall Risks  Follow up Falls prevention discussed Falls prevention discussed Falls prevention discussed Falls prevention discussed Falls prevention discussed      Functional Status Survey: Is the patient deaf or have difficulty hearing?: No Does the patient have difficulty seeing, even when wearing glasses/contacts?: No Does the patient have difficulty concentrating, remembering, or making decisions?: No Does the patient have difficulty walking or climbing stairs?: No Does the patient have difficulty dressing or bathing?: No Does the patient have difficulty doing errands alone such as visiting a doctor's office or shopping?: No    Assessment & Plan  1. Dyslipidemia  Discussed healthier diet with patient, we will monitor   2. Major depression in remission (HCC)  - sertraline (ZOLOFT) 50 MG tablet; Take 1 tablet (50 mg total) by mouth daily.  Dispense: 90 tablet; Refill: 3  3. Anxiety  - sertraline (ZOLOFT) 50 MG tablet; Take 1 tablet (50 mg total) by mouth daily.  Dispense: 90 tablet; Refill: 3  4. B12 deficiency  - Cyanocobalamin (B-12) 1000 MCG SUBL; Place 1 tablet under the tongue daily.  Dispense: 100 tablet; Refill: 3 - cyanocobalamin (VITAMIN B12) injection 1,000 mcg  5. Vitamin D deficiency  - Cholecalciferol (VITAMIN D) 50 MCG (2000 UT) CAPS; Take 1 capsule (2,000 Units total) by mouth daily.  6. Major depression in remission (HCC)  - sertraline (ZOLOFT) 50 MG tablet; Take 1 tablet (50 mg total) by mouth daily.  Dispense: 90 tablet; Refill: 3

## 2022-09-06 ENCOUNTER — Ambulatory Visit (INDEPENDENT_AMBULATORY_CARE_PROVIDER_SITE_OTHER): Payer: No Typology Code available for payment source | Admitting: Family Medicine

## 2022-09-06 ENCOUNTER — Encounter: Payer: Self-pay | Admitting: Family Medicine

## 2022-09-06 VITALS — BP 126/72 | HR 92 | Resp 16 | Ht 63.0 in | Wt 139.0 lb

## 2022-09-06 DIAGNOSIS — E538 Deficiency of other specified B group vitamins: Secondary | ICD-10-CM

## 2022-09-06 DIAGNOSIS — F325 Major depressive disorder, single episode, in full remission: Secondary | ICD-10-CM | POA: Diagnosis not present

## 2022-09-06 DIAGNOSIS — F419 Anxiety disorder, unspecified: Secondary | ICD-10-CM | POA: Diagnosis not present

## 2022-09-06 DIAGNOSIS — E785 Hyperlipidemia, unspecified: Secondary | ICD-10-CM

## 2022-09-06 DIAGNOSIS — E559 Vitamin D deficiency, unspecified: Secondary | ICD-10-CM

## 2022-09-06 MED ORDER — B-12 1000 MCG SL SUBL: 1.0000 | SUBLINGUAL_TABLET | Freq: Every day | SUBLINGUAL | 3 refills | Status: DC

## 2022-09-06 MED ORDER — SERTRALINE HCL 50 MG PO TABS: 50.0000 mg | ORAL_TABLET | Freq: Every day | ORAL | 3 refills | Status: DC

## 2022-09-06 MED ORDER — VITAMIN D 50 MCG (2000 UT) PO CAPS: 1.0000 | ORAL_CAPSULE | Freq: Every day | ORAL | Status: DC

## 2022-09-06 MED ORDER — CYANOCOBALAMIN 1000 MCG/ML IJ SOLN
1000.0000 ug | Freq: Once | INTRAMUSCULAR | Status: AC
  Administered 2022-09-06: 1000 ug via INTRAMUSCULAR

## 2022-09-17 ENCOUNTER — Ambulatory Visit: Payer: No Typology Code available for payment source | Admitting: Family Medicine

## 2022-11-13 ENCOUNTER — Other Ambulatory Visit: Payer: Self-pay | Admitting: Family Medicine

## 2022-11-13 DIAGNOSIS — Z1231 Encounter for screening mammogram for malignant neoplasm of breast: Secondary | ICD-10-CM

## 2022-12-09 ENCOUNTER — Ambulatory Visit
Admission: RE | Admit: 2022-12-09 | Discharge: 2022-12-09 | Disposition: A | Discharge status: Home / Self Care | Payer: No Typology Code available for payment source | Source: Ambulatory Visit

## 2022-12-09 DIAGNOSIS — Z1231 Encounter for screening mammogram for malignant neoplasm of breast: Secondary | ICD-10-CM

## 2023-03-05 ENCOUNTER — Other Ambulatory Visit: Payer: Self-pay | Admitting: Family Medicine

## 2023-03-05 DIAGNOSIS — F419 Anxiety disorder, unspecified: Secondary | ICD-10-CM

## 2023-03-05 DIAGNOSIS — F325 Major depressive disorder, single episode, in full remission: Secondary | ICD-10-CM

## 2023-03-06 NOTE — Telephone Encounter (Signed)
 Requested Prescriptions  Refused Prescriptions Disp Refills   sertraline  (ZOLOFT ) 50 MG tablet [Pharmacy Med Name: SERTRALINE  HCL 50 MG TABLET] 90 tablet 2    Sig: TAKE 1 TABLET BY MOUTH EVERY DAY     Psychiatry:  Antidepressants - SSRI - sertraline  Failed - 03/06/2023  1:05 PM      Failed - Valid encounter within last 6 months    Recent Outpatient Visits           6 months ago Dyslipidemia   [facility] Glenard Mire, MD   9 months ago Well adult exam   [facility] Glenard Mire, MD   1 year ago Major depression in remission [facility])   [facility] Glenard Mire, MD   1 year ago Well adult exam   [facility] Sowles, Krichna, MD   2 years ago Gastroesophageal reflux disease without esophagitis   [facility] Glenard Mire, MD       Future Appointments             In 6 days Alm Delon SAILOR, DO [facility]   In 2 months Sowles, Krichna, MD [facility]            Passed - AST in normal range and within 360 days    AST  Date Value Ref Range Status  05/16/2022 12 10 - 35 U/L Final         Passed - ALT in normal range and within 360 days    ALT  Date Value Ref Range Status  05/16/2022 13 6 - 29 U/L Final         Passed - Completed PHQ-2 or PHQ-9 in the last 360 days

## 2023-03-12 ENCOUNTER — Ambulatory Visit: Payer: No Typology Code available for payment source | Admitting: Dermatology

## 2023-03-12 ENCOUNTER — Encounter: Payer: Self-pay | Admitting: Dermatology

## 2023-03-12 VITALS — BP 119/79 | HR 80

## 2023-03-12 DIAGNOSIS — D485 Neoplasm of uncertain behavior of skin: Secondary | ICD-10-CM

## 2023-03-12 DIAGNOSIS — W908XXA Exposure to other nonionizing radiation, initial encounter: Secondary | ICD-10-CM

## 2023-03-12 DIAGNOSIS — D229 Melanocytic nevi, unspecified: Secondary | ICD-10-CM

## 2023-03-12 DIAGNOSIS — L814 Other melanin hyperpigmentation: Secondary | ICD-10-CM | POA: Diagnosis not present

## 2023-03-12 DIAGNOSIS — L821 Other seborrheic keratosis: Secondary | ICD-10-CM

## 2023-03-12 DIAGNOSIS — D492 Neoplasm of unspecified behavior of bone, soft tissue, and skin: Secondary | ICD-10-CM

## 2023-03-12 DIAGNOSIS — D1801 Hemangioma of skin and subcutaneous tissue: Secondary | ICD-10-CM | POA: Diagnosis not present

## 2023-03-12 DIAGNOSIS — Z1283 Encounter for screening for malignant neoplasm of skin: Secondary | ICD-10-CM | POA: Diagnosis not present

## 2023-03-12 DIAGNOSIS — L719 Rosacea, unspecified: Secondary | ICD-10-CM

## 2023-03-12 DIAGNOSIS — L578 Other skin changes due to chronic exposure to nonionizing radiation: Secondary | ICD-10-CM

## 2023-03-12 DIAGNOSIS — D171 Benign lipomatous neoplasm of skin and subcutaneous tissue of trunk: Secondary | ICD-10-CM

## 2023-03-12 NOTE — Patient Instructions (Addendum)

## 2023-03-12 NOTE — Progress Notes (Signed)
New Patient Visit   Subjective  Gwendolyn Hull is a 48 y.o. female who presents for the following: Total Body Skin Exam (TBSE)  Patient present today for new patient visit for TBSE.The patient reports she has spots, moles and lesions to be evaluated, some may be new or changing and the patient may have concern these could be cancer. Patient has previously been treated by a dermatologist (2019). Patient reports she has hx of bx (Benign). Patient denies family history of skin cancers. Patient reports throughout her lifetime has had  severe  sun exposure. Currently, patient reports if she has excessive sun exposure, she does apply sunscreen and/or wears protective coverings.  The following portions of the chart were reviewed this encounter and updated as appropriate: medications, allergies, medical history  Review of Systems:  No other skin or systemic complaints except as noted in HPI or Assessment and Plan.  Objective  Well appearing patient in no apparent distress; mood and affect are within normal limits.  A full examination was performed including scalp, head, eyes, ears, nose, lips, neck, chest, axillae, abdomen, back, buttocks, bilateral upper extremities, bilateral lower extremities, hands, feet, fingers, toes, fingernails, and toenails. All findings within normal limits unless otherwise noted below.   Relevant exam findings are noted in the Assessment and Plan.       Superior Gluteal Fold 7 mm Pink fleshy papule with cobblestone features Lateral Right Gluteal Fold 6 mm Pink fleshy papule with cobblestone features Medial Right Gluteal Fold 6 mm Pink fleshy papule with cobblestone features  Assessment & Plan   LENTIGINES, SEBORRHEIC KERATOSES, HEMANGIOMAS - Benign normal skin lesions - Benign-appearing - Call for any changes  MILD ROSACEA Exam: Mid face erythema with telangiectasias +/- scattered inflammatory papules  Flared  Rosacea is a chronic progressive skin  condition usually affecting the face of adults, causing redness and/or acne bumps. It is treatable but not curable. It sometimes affects the eyes (ocular rosacea) as well. It may respond to topical and/or systemic medication and can flare with stress, sun exposure, alcohol, exercise, topical steroids (including hydrocortisone/cortisone 10) and some foods.  Daily application of broad spectrum spf 30+ sunscreen to face is recommended to reduce flares.  MELANOCYTIC NEVI - Tan-brown and/or pink-flesh-colored symmetric macules and papules - Benign appearing on exam today - Observation - Call clinic for new or changing moles - Recommend daily use of broad spectrum spf 30+ sunscreen to sun-exposed areas.   ACTINIC DAMAGE - Chronic condition, secondary to cumulative UV/sun exposure - diffuse scaly erythematous macules with underlying dyspigmentation - Recommend daily broad spectrum sunscreen SPF 30+ to sun-exposed areas, reapply every 2 hours as needed.  - Staying in the shade or wearing long sleeves, sun glasses (UVA+UVB protection) and wide brim hats (4-inch brim around the entire circumference of the hat) are also recommended for sun protection.  - Call for new or changing lesions.  SKIN CANCER SCREENING PERFORMED TODAY  NEOPLASM OF UNCERTAIN BEHAVIOR OF SKIN (3) Superior Gluteal Fold Epidermal / dermal shaving  Lesion diameter (cm):  0.7 Informed consent: discussed and consent obtained   Timeout: patient name, date of birth, surgical site, and procedure verified   Procedure prep:  Patient was prepped and draped in usual sterile fashion Prep type:  Isopropyl alcohol Anesthesia: the lesion was anesthetized in a standard fashion   Anesthetic:  1% lidocaine w/ epinephrine 1-100,000 buffered w/ 8.4% NaHCO3 Instrument used: DermaBlade   Hemostasis achieved with: pressure, aluminum chloride and electrodesiccation   Outcome: patient  tolerated procedure well   Post-procedure details: sterile  dressing applied and wound care instructions given   Dressing type: bandage and petrolatum   Specimen A - Surgical pathology Differential Diagnosis: Wart vs other  Check Margins: No Lateral Right Gluteal Fold Epidermal / dermal shaving  Lesion diameter (cm):  0.6 Informed consent: discussed and consent obtained   Timeout: patient name, date of birth, surgical site, and procedure verified   Procedure prep:  Patient was prepped and draped in usual sterile fashion Prep type:  Isopropyl alcohol Anesthesia: the lesion was anesthetized in a standard fashion   Anesthetic:  1% lidocaine w/ epinephrine 1-100,000 buffered w/ 8.4% NaHCO3 Instrument used: DermaBlade   Hemostasis achieved with: pressure, aluminum chloride and electrodesiccation   Outcome: patient tolerated procedure well   Post-procedure details: sterile dressing applied and wound care instructions given   Dressing type: bandage and petrolatum   Specimen B - Surgical pathology Differential Diagnosis: Wart Vs Other  Check Margins: No Medial Right Gluteal Fold Epidermal / dermal shaving  Lesion diameter (cm):  0.6 Informed consent: discussed and consent obtained   Timeout: patient name, date of birth, surgical site, and procedure verified   Procedure prep:  Patient was prepped and draped in usual sterile fashion Prep type:  Isopropyl alcohol Anesthesia: the lesion was anesthetized in a standard fashion   Anesthetic:  1% lidocaine w/ epinephrine 1-100,000 buffered w/ 8.4% NaHCO3 Instrument used: flexible razor blade   Hemostasis achieved with: pressure, aluminum chloride and electrodesiccation   Outcome: patient tolerated procedure well   Post-procedure details: sterile dressing applied and wound care instructions given   Dressing type: bandage and petrolatum   Specimen C - Surgical pathology Differential Diagnosis: Wart vs other  Check Margins: No  Return in about 1 year (around 03/11/2024) for TBSE.  Documentation: I  have reviewed the above documentation for accuracy and completeness, and I agree with the above.  Stasia Cavalier, am acting as scribe for Langston Reusing, DO.   Langston Reusing, DO

## 2023-03-13 ENCOUNTER — Other Ambulatory Visit: Payer: Self-pay | Admitting: Family Medicine

## 2023-03-13 ENCOUNTER — Encounter: Payer: Self-pay | Admitting: Family Medicine

## 2023-03-13 DIAGNOSIS — F419 Anxiety disorder, unspecified: Secondary | ICD-10-CM

## 2023-03-13 DIAGNOSIS — F325 Major depressive disorder, single episode, in full remission: Secondary | ICD-10-CM

## 2023-03-13 LAB — SURGICAL PATHOLOGY

## 2023-03-13 MED ORDER — SERTRALINE HCL 50 MG PO TABS: 50.0000 mg | ORAL_TABLET | Freq: Every day | ORAL | 2 refills | Status: DC

## 2023-03-25 ENCOUNTER — Encounter: Payer: Self-pay | Admitting: Dermatology

## 2023-03-25 NOTE — Progress Notes (Signed)
 Hi Gwendolyn Hull  Dr. Onalee Hua reviewed your biopsy results and they showed the spots removed were benign (not cancerous).  No additional treatment is required.  The detailed report is available to view in MyChart.  Have a great day!  Kind Regards,  Dr. Kermit Balo Care Team

## 2023-05-19 ENCOUNTER — Encounter: Payer: Self-pay | Admitting: Family Medicine

## 2023-05-19 ENCOUNTER — Ambulatory Visit (INDEPENDENT_AMBULATORY_CARE_PROVIDER_SITE_OTHER): Payer: Self-pay | Admitting: Family Medicine

## 2023-05-19 VITALS — BP 112/64 | HR 95 | Temp 98.1°F | Resp 18 | Ht 63.0 in | Wt 145.0 lb

## 2023-05-19 DIAGNOSIS — Z0000 Encounter for general adult medical examination without abnormal findings: Secondary | ICD-10-CM | POA: Diagnosis not present

## 2023-05-19 DIAGNOSIS — E559 Vitamin D deficiency, unspecified: Secondary | ICD-10-CM | POA: Insufficient documentation

## 2023-05-19 DIAGNOSIS — Z1211 Encounter for screening for malignant neoplasm of colon: Secondary | ICD-10-CM

## 2023-05-19 DIAGNOSIS — Z79899 Other long term (current) drug therapy: Secondary | ICD-10-CM

## 2023-05-19 DIAGNOSIS — Z131 Encounter for screening for diabetes mellitus: Secondary | ICD-10-CM

## 2023-05-19 DIAGNOSIS — Z1231 Encounter for screening mammogram for malignant neoplasm of breast: Secondary | ICD-10-CM | POA: Diagnosis not present

## 2023-05-19 DIAGNOSIS — E538 Deficiency of other specified B group vitamins: Secondary | ICD-10-CM | POA: Insufficient documentation

## 2023-05-19 DIAGNOSIS — E785 Hyperlipidemia, unspecified: Secondary | ICD-10-CM

## 2023-05-19 HISTORY — DX: Hyperlipidemia, unspecified: E78.5

## 2023-05-19 NOTE — Progress Notes (Signed)
 Name: Gwendolyn Hull   MRN: [id number]    DOB: April 28, 1975   Date:05/19/2023       Progress Note  Subjective  Chief Complaint  Chief Complaint  Patient presents with   Annual Exam    HPI  Patient presents for annual CPE.  Diet: balanced  Exercise: she is very active   Last Eye Exam: completed Last Dental Exam: completed  Flowsheet Row Office Visit from 05/14/2021 in [facility]  AUDIT-C Score 2      Depression: Phq 9 is  negative    05/19/2023    9:08 AM 09/06/2022   10:04 AM 05/16/2022    9:21 AM 07/26/2021   10:18 AM 05/14/2021    3:08 PM  Depression screen PHQ 2/9  Decreased Interest 0 0 0 0 0  Down, Depressed, Hopeless 0 0 0 0 0  PHQ - 2 Score 0 0 0 0 0  Altered sleeping 1 0 0 0 0  Tired, decreased energy 0 0 0 0 0  Change in appetite 0 0 0 0 0  Feeling bad or failure about yourself  0 0 0 0 0  Trouble concentrating 0 0 0 0 0  Moving slowly or fidgety/restless 0 0 0 0 0  Suicidal thoughts 0 0 0 0 0  PHQ-9 Score 1 0 0 0 0  Difficult doing work/chores Not difficult at all       Hypertension: BP Readings from Last 3 Encounters:  05/19/23 112/64  03/12/23 119/79  09/06/22 126/72   Obesity: Wt Readings from Last 3 Encounters:  05/19/23 145 lb (65.8 kg)  09/06/22 139 lb (63 kg)  05/16/22 140 lb (63.5 kg)   BMI Readings from Last 3 Encounters:  05/19/23 25.69 kg/m  09/06/22 24.62 kg/m  05/16/22 24.80 kg/m     Vaccines: reviewed with the patient.   Hep C Screening: completed STD testing and prevention (HIV/chl/gon/syphilis): N/A Intimate partner violence: negative screen  Sexual History : no pain or problems  Menstrual History/LMP/Abnormal Bleeding: she has an IUD, spreading cycles to every other month  Discussed importance of follow up if any post-menopausal bleeding: not applicable  Incontinence Symptoms: negative for symptoms   Breast cancer:  - Last Mammogram: up to date  - BRCA gene screening: mother had breast cancer    Osteoporosis Prevention : Discussed high calcium and vitamin D  supplementation, weight bearing exercises Bone density :not applicable   Cervical cancer screening: up-to-date  Skin cancer: Discussed monitoring for atypical lesions  Colorectal cancer: referral placed    Lung cancer:  Low Dose CT Chest recommended if Age 59-80 years, 20 pack-year currently smoking OR have quit w/in 15years. Patient does not qualify for screen   ECG: next visit   Advanced Care Planning: A voluntary discussion about advance care planning including the explanation and discussion of advance directives.  Discussed health care proxy and Living will, and the patient was able to identify a health care proxy as partner .  Patient does have a living will and power of attorney of health care   Patient Active Problem List   Diagnosis Date Noted   Major depression in remission (HCC) 07/26/2021   Family history of breast cancer 03/22/2019   Anxiety 04/04/2017   Seasonal allergic rhinitis 04/04/2017    Past Surgical History:  Procedure Laterality Date   CESAREAN SECTION  01/1998, 10/1999, 04/2002   COLONOSCOPY WITH PROPOFOL  N/A 09/06/2020   Procedure: COLONOSCOPY WITH PROPOFOL ;  Surgeon: Irby Mannan, MD;  Location: [facility] ENDOSCOPY;  Service: Endoscopy;  Laterality: N/A;    Family History  Problem Relation Age of Onset   Breast cancer Mother 76   Cancer Mother        breast cancer 62 (age 62), tongue cancer 2011   Diabetes Mother    Cancer Father 61       colon cancer   Diabetes Father    Hearing loss Father    Depression Brother    Colon cancer Maternal Grandmother    Heart disease Maternal Grandfather    Cancer Paternal Grandfather    Diabetes Paternal Grandfather     Social History   Socioeconomic History   Marital status: Divorced    Spouse name: Zoila Hines   Number of children: 3   Years of education: Not on file   Highest education level: Bachelor's degree (e.g., BA, AB, BS)   Occupational History   Occupation: Runner, broadcasting/film/video   Tobacco Use   Smoking status: Never    Passive exposure: Never   Smokeless tobacco: Never  Vaping Use   Vaping status: Never Used  Substance and Sexual Activity   Alcohol use: Yes    Comment: occasional drinks, no more than 3 in an evening   Drug use: No   Sexual activity: Yes    Partners: Male    Birth control/protection: I.U.D.  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    Two older sons in college in [place]     Social Drivers of Health   Financial Resource Strain: Low Risk  (05/16/2022)   Overall Financial Resource Strain (CARDIA)    Difficulty of Paying Living Expenses: Not hard at all  Food Insecurity: No Food Insecurity (05/16/2022)   Hunger Vital Sign    Worried About Running Out of Food in the Last Year: Never true    Ran Out of Food in the Last Year: Never true  Transportation Needs: No Transportation Needs (05/16/2022)   PRAPARE - Administrator, Civil Service (Medical): No    Lack of Transportation (Non-Medical): No  Physical Activity: Inactive (05/16/2022)   Exercise Vital Sign    Days of Exercise per Week: 0 days    Minutes of Exercise per Session: 0 min  Stress: No Stress Concern Present (05/16/2022)   [employer] of Occupational Health - Occupational Stress Questionnaire    Feeling of Stress : Only a little  Social Connections: Moderately Integrated (05/16/2022)   Social Connection and Isolation Panel [NHANES]    Frequency of Communication with Friends and Family: More than three times a week    Frequency of Social Gatherings with Friends and Family: More than three times a week     Attends Religious Services: Never    Database administrator or Organizations: Yes    Attends Engineer, structural: More than 4 times per year    Marital Status: Living with partner  Intimate Partner Violence: Not At Risk (05/16/2022)   Humiliation, Afraid, Rape, and Kick questionnaire    Fear of Current or Ex-Partner: No    Emotionally Abused: No  Physically Abused: No    Sexually Abused: No     Current Outpatient Medications:    sertraline  (ZOLOFT ) 50 MG tablet, Take 1 tablet (50 mg total) by mouth daily., Disp: 90 tablet, Rfl: 2  No Known Allergies   ROS  Constitutional: Negative for fever or weight change.  Respiratory: Negative for cough and shortness of breath.   Cardiovascular: Negative for chest pain or palpitations.  Gastrointestinal: Negative for abdominal pain, no bowel changes.  Musculoskeletal: Negative for gait problem or joint swelling.  Skin: Negative for rash.  Neurological: Negative for dizziness or headache.  No other specific complaints in a complete review of systems (except as listed in HPI above).   Objective  Vitals:   05/19/23 0857  BP: 112/64  Pulse: 95  Resp: 18  Temp: 98.1 F (36.7 C)  SpO2: 95%  Weight: 145 lb (65.8 kg)  Height: 5\' 3"  (1.6 m)    Body mass index is 25.69 kg/m.  Physical Exam  Constitutional: Patient appears well-developed and well-nourished. No distress.  HENT: Head: Normocephalic and atraumatic. Ears: B TMs ok, no erythema or effusion; Nose: Nose normal. Mouth/Throat: Oropharynx is clear and moist. No oropharyngeal exudate.  Eyes: Conjunctivae and EOM are normal. Pupils are equal, round, and reactive to light. No scleral icterus.  Neck: Normal range of motion. Neck supple. No JVD present. No thyromegaly present.  Cardiovascular: Normal rate, regular rhythm and normal heart sounds.  No murmur heard. No BLE edema. Pulmonary/Chest: Effort normal and breath sounds normal. No respiratory distress. Abdominal:  Soft. Bowel sounds are normal, no distension. There is no tenderness. no masses Breast: not done  FEMALE GENITALIA:  Not done  RECTAL: not done  Musculoskeletal: Normal range of motion, no joint effusions. No gross deformities Neurological: he is alert and oriented to person, place, and time. No cranial nerve deficit. Coordination, balance, strength, speech and gait are normal.  Skin: Skin is warm and dry. sees dermatologist  Psychiatric: Patient has a normal mood and affect. behavior is normal. Judgment and thought content normal.     Assessment & Plan  1. Well adult exam (Primary)  - MM 3D SCREENING MAMMOGRAM BILATERAL BREAST; Future - Ambulatory referral to Gastroenterology - CBC with Differential/Platelet - Comprehensive metabolic panel with GFR - B12 and Folate Panel - Lipid panel - VITAMIN D  25 Hydroxy (Vit-D Deficiency, Fractures) - Hemoglobin A1c  2. Screening for colon cancer  - Ambulatory referral to Gastroenterology  3. Encounter for screening mammogram for malignant neoplasm of breast  - MM 3D SCREENING MAMMOGRAM BILATERAL BREAST; Future  4. B12 deficiency  - B12 and Folate Panel  5. Vitamin D  deficiency  - VITAMIN D  25 Hydroxy (Vit-D Deficiency, Fractures)  6. Dyslipidemia  - Lipid panel  7. Diabetes mellitus screening  - Hemoglobin A1c  8. Long-term use of high-risk medication  - CBC with Differential/Platelet - Comprehensive metabolic panel with GFR   -USPSTF grade A and B recommendations reviewed with patient; age-appropriate recommendations, preventive care, screening tests, etc discussed and encouraged; healthy living encouraged; see AVS for patient education given to patient -Discussed importance of 150 minutes of physical activity weekly, eat two servings of fish weekly, eat one serving of tree nuts ( cashews, pistachios, pecans, almonds.Aaron Aas) every other day, eat 6 servings of fruit/vegetables daily and drink plenty of water and avoid sweet  beverages.   -Reviewed Health Maintenance: Yes.

## 2023-11-07 ENCOUNTER — Other Ambulatory Visit: Payer: Self-pay | Admitting: Family Medicine

## 2023-11-07 DIAGNOSIS — Z1231 Encounter for screening mammogram for malignant neoplasm of breast: Secondary | ICD-10-CM

## 2023-11-15 LAB — COMPREHENSIVE METABOLIC PANEL WITH GFR
AG Ratio: 1.9 (calc) (ref 1.0–2.5)
ALT: 21 U/L (ref 6–29)
AST: 12 U/L (ref 10–35)
Albumin: 4.4 g/dL (ref 3.6–5.1)
Alkaline phosphatase (APISO): 86 U/L (ref 37–153)
BUN: 13 mg/dL (ref 7–25)
CO2: 28 mmol/L (ref 20–32)
Calcium: 9.3 mg/dL (ref 8.6–10.4)
Chloride: 103 mmol/L (ref 98–110)
Creat: 0.59 mg/dL (ref 0.50–1.03)
Globulin: 2.3 g/dL (ref 1.9–3.7)
Glucose, Bld: 89 mg/dL (ref 65–99)
Potassium: 4.2 mmol/L (ref 3.5–5.3)
Sodium: 140 mmol/L (ref 135–146)
Total Bilirubin: 0.6 mg/dL (ref 0.2–1.2)
Total Protein: 6.7 g/dL (ref 6.1–8.1)
eGFR: 110 mL/min/1.73m2 (ref >=60)

## 2023-11-15 LAB — B12 AND FOLATE PANEL
Folate: 19.7 ng/mL
Vitamin B-12: 1392 pg/mL — ABNORMAL HIGH (ref 200–1100)

## 2023-11-15 LAB — HEMOGLOBIN A1C
Hgb A1c MFr Bld: 5.5 % (ref <5.7)
Mean Plasma Glucose: 111 mg/dL
eAG (mmol/L): 6.2 mmol/L

## 2023-11-15 LAB — CBC WITH DIFFERENTIAL/PLATELET
Absolute Lymphocytes: 1877 {cells}/uL (ref 850–3900)
Absolute Monocytes: 559 {cells}/uL (ref 200–950)
Basophils Absolute: 41 {cells}/uL (ref 0–200)
Basophils Relative: 0.6 %
Eosinophils Absolute: 83 {cells}/uL (ref 15–500)
Eosinophils Relative: 1.2 %
HCT: 40.7 % (ref 35.0–45.0)
Hemoglobin: 13.7 g/dL (ref 11.7–15.5)
MCH: 31.8 pg (ref 27.0–33.0)
MCHC: 33.7 g/dL (ref 32.0–36.0)
MCV: 94.4 fL (ref 80.0–100.0)
MPV: 9.8 fL (ref 7.5–12.5)
Monocytes Relative: 8.1 %
Neutro Abs: 4340 {cells}/uL (ref 1500–7800)
Neutrophils Relative %: 62.9 %
Platelets: 278 Thousand/uL (ref 140–400)
RBC: 4.31 Million/uL (ref 3.80–5.10)
RDW: 11.8 % (ref 11.0–15.0)
Total Lymphocyte: 27.2 %
WBC: 6.9 Thousand/uL (ref 3.8–10.8)

## 2023-11-15 LAB — LIPID PANEL
Cholesterol: 249 mg/dL — ABNORMAL HIGH (ref <200)
HDL: 53 mg/dL (ref >=50)
LDL Cholesterol (Calc): 150 mg/dL — ABNORMAL HIGH
Non-HDL Cholesterol (Calc): 196 mg/dL — ABNORMAL HIGH (ref <130)
Total CHOL/HDL Ratio: 4.7 (calc) (ref <5.0)
Triglycerides: 283 mg/dL — ABNORMAL HIGH (ref <150)

## 2023-11-15 LAB — VITAMIN D 25 HYDROXY (VIT D DEFICIENCY, FRACTURES): Vit D, 25-Hydroxy: 40 ng/mL (ref 30–100)

## 2023-11-17 ENCOUNTER — Ambulatory Visit: Payer: Self-pay | Admitting: Family Medicine

## 2023-11-18 ENCOUNTER — Ambulatory Visit: Admitting: Family Medicine

## 2023-11-18 ENCOUNTER — Encounter: Payer: Self-pay | Admitting: Family Medicine

## 2023-11-18 VITALS — BP 108/74 | HR 92 | Resp 16 | Ht 63.0 in | Wt 151.1 lb

## 2023-11-18 DIAGNOSIS — E538 Deficiency of other specified B group vitamins: Secondary | ICD-10-CM

## 2023-11-18 DIAGNOSIS — E559 Vitamin D deficiency, unspecified: Secondary | ICD-10-CM

## 2023-11-18 DIAGNOSIS — F419 Anxiety disorder, unspecified: Secondary | ICD-10-CM

## 2023-11-18 DIAGNOSIS — E785 Hyperlipidemia, unspecified: Secondary | ICD-10-CM | POA: Diagnosis not present

## 2023-11-18 DIAGNOSIS — Z23 Encounter for immunization: Secondary | ICD-10-CM

## 2023-11-18 DIAGNOSIS — F325 Major depressive disorder, single episode, in full remission: Secondary | ICD-10-CM

## 2023-11-18 DIAGNOSIS — R635 Abnormal weight gain: Secondary | ICD-10-CM

## 2023-11-18 MED ORDER — BUPROPION HCL ER (XL) 150 MG PO TB24: 150.0000 mg | ORAL_TABLET | Freq: Every day | ORAL | 1 refills | Status: AC

## 2023-11-18 MED ORDER — METFORMIN HCL ER 500 MG PO TB24: 500.0000 mg | ORAL_TABLET | Freq: Every day | ORAL | 1 refills | Status: AC

## 2023-11-18 MED ORDER — SERTRALINE HCL 50 MG PO TABS
50.0000 mg | ORAL_TABLET | Freq: Every day | ORAL | 1 refills | Status: AC
Start: 2023-11-18 — End: ?

## 2023-11-18 NOTE — Progress Notes (Signed)
 Name: Gwendolyn Hull   MRN: [id number]    DOB: [birth year]   Date:11/18/2023       Progress Note  Subjective  Chief Complaint  Chief Complaint  Patient presents with   Medical Management of Chronic Issues   Discussed the use of AI scribe software for clinical note transcription with the patient, who gave verbal consent to proceed.  History of Present Illness Gwendolyn Hull is a 48 year old female with depression and dyslipidemia who presents for follow-up on her mental health and cholesterol management.  She has experienced depression since postpartum and has been on sertraline  for approximately 25 years, currently taking 50 mg daily. During more challenging periods, she has increased her dose to 75 or 100 mg after consulting with her provider. She manages daily activities such as eating and taking care of her house. Her mood is stable, but she experiences emotional fluctuations, particularly before her menstrual cycle.  She has a history of dyslipidemia with recent lab work showing elevated cholesterol levels, including an LDL of 150 mg/dL and triglycerides of 716 mg/dL, while fasting. Her HDL levels are within normal range. She has a family history of heart disease on her maternal grandfather's side, though both her parents have no history of heart attacks or strokes. She is concerned about her cholesterol levels and their potential link to her recent weight gain.  She is experiencing perimenopausal symptoms, including weight gain, particularly around the abdomen, and occasional night sweats. Her menstrual cycles remain regular. She attributes some of her weight gain to perimenopausal changes and notes that her body feels different. She is also experiencing emotional changes, which she associates with hormonal fluctuations.  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 She lives in [place], approximately 20 minutes from her workplace. Her  youngest child is graduating in December, and her middle child is getting married in January. She has been dating someone new since her previous relationship ended last spring.    The 10-year ASCVD risk score (Arnett DK, et al., 2019) is: 1.3%   Values used to calculate the score:     Age: 87 years     Clincally relevant sex: Female     Is Non-Hispanic African American: No     Diabetic: No     Tobacco smoker: No     Systolic Blood Pressure: 108 mmHg     Is BP treated: No     HDL Cholesterol: 53 mg/dL     Total Cholesterol: 249 mg/dL    Patient Active Problem List   Diagnosis Date Noted   B12 deficiency 05/19/2023   Vitamin D  deficiency 05/19/2023   Dyslipidemia 05/19/2023   Major depression in remission 07/26/2021   Family history of breast cancer 03/22/2019   Anxiety 04/04/2017   Seasonal allergic rhinitis 04/04/2017    Past Surgical History:  Procedure Laterality Date   CESAREAN SECTION  01/1998, 10/1999, 04/2002   COLONOSCOPY WITH PROPOFOL  N/A 09/06/2020   Procedure: COLONOSCOPY WITH PROPOFOL ;  Surgeon: Janalyn Keene NOVAK, MD;  Location: [facility] END[facility];  Service: Endoscopy;  Laterality: N/A;    Family History  Problem Relation Age of Onset   Breast cancer Mother 70   Cancer Mother        breast cancer 21 (age 86), tongue cancer 2011   Diabetes Mother    Cancer Father 71       colon cancer   Diabetes Father    Hearing loss Father  Depression Brother    Colon cancer Maternal Grandmother    Heart disease Maternal Grandfather    Cancer Paternal Grandfather    Diabetes Paternal Grandfather     Social History   Tobacco Use   Smoking status: Never    Passive exposure: Never   Smokeless tobacco: Never  Substance Use Topics   Alcohol use: Yes    Comment: occasional drinks, no more than 3 in an evening     Current Outpatient Medications:    sertraline  (ZOLOFT ) 50 MG tablet, Take 1 tablet (50 mg total) by mouth daily., Disp: 90 tablet, Rfl: 2  No Known  Allergies  I personally reviewed active problem list, medication list, allergies with the patient/caregiver today.   ROS  Ten systems reviewed and is negative except as mentioned in HPI    Objective Physical Exam CONSTITUTIONAL: Patient appears well-developed and well-nourished.  No distress. HEENT: Head atraumatic, normocephalic, neck supple. CARDIOVASCULAR: Normal rate, regular rhythm and normal heart sounds.  No murmur heard. No BLE edema. PULMONARY: Effort normal and breath sounds normal. No respiratory distress. ABDOMINAL: There is no tenderness or distention. MUSCULOSKELETAL: Normal gait. Without gross motor or sensory deficit. PSYCHIATRIC: Patient has a normal mood and affect. behavior is normal. Judgment and thought content normal.  Vitals:   11/18/23 0814  BP: 108/74  Pulse: 92  Resp: 16  SpO2: 99%  Weight: 151 lb 1.6 oz (68.5 kg)  Height: 5' 3 (1.6 m)    Body mass index is 26.77 kg/m.  Recent Results (from the past 2160 hours)  CBC with Differential/Platelet     Status: None   Collection Time: 11/14/23  8:19 AM  Result Value Ref Range   WBC 6.9 3.8 - 10.8 Thousand/uL   RBC 4.31 3.80 - 5.10 Million/uL   Hemoglobin 13.7 11.7 - 15.5 g/dL   HCT 59.2 64.9 - 54.9 %   MCV 94.4 80.0 - 100.0 fL   MCH 31.8 27.0 - 33.0 pg   MCHC 33.7 32.0 - 36.0 g/dL    Comment: For adults, a slight decrease in the calculated MCHC value (in the range of 30 to 32 g/dL) is most likely not clinically significant; however, it should be interpreted with caution in correlation with other red cell parameters and the patient's clinical condition.    RDW 11.8 11.0 - 15.0 %   Platelets 278 140 - 400 Thousand/uL   MPV 9.8 7.5 - 12.5 fL   Neutro Abs 4,340 1,500 - 7,800 cells/uL   Absolute Lymphocytes 1,877 850 - 3,900 cells/uL   Absolute Monocytes 559 200 - 950 cells/uL   Eosinophils Absolute 83 15 - 500 cells/uL   Basophils Absolute 41 0 - 200 cells/uL   Neutrophils Relative % 62.9 %    Total Lymphocyte 27.2 %   Monocytes Relative 8.1 %   Eosinophils Relative 1.2 %   Basophils Relative 0.6 %  Comprehensive metabolic panel with GFR     Status: None   Collection Time: 11/14/23  8:19 AM  Result Value Ref Range   Glucose, Bld 89 65 - 99 mg/dL    Comment: .            Fasting reference interval .    BUN 13 7 - 25 mg/dL   Creat 9.40 9.49 - 8.96 mg/dL   eGFR 889 > OR = 60 fO/fpw/8.26f7   BUN/Creatinine Ratio SEE NOTE: 6 - 22 (calc)    Comment:    Not Reported: BUN and Creatinine are within  reference range. .    Sodium 140 135 - 146 mmol/L   Potassium 4.2 3.5 - 5.3 mmol/L   Chloride 103 98 - 110 mmol/L   CO2 28 20 - 32 mmol/L   Calcium 9.3 8.6 - 10.4 mg/dL   Total Protein 6.7 6.1 - 8.1 g/dL   Albumin 4.4 3.6 - 5.1 g/dL   Globulin 2.3 1.9 - 3.7 g/dL (calc)   AG Ratio 1.9 1.0 - 2.5 (calc)   Total Bilirubin 0.6 0.2 - 1.2 mg/dL   Alkaline phosphatase (APISO) 86 37 - 153 U/L   AST 12 10 - 35 U/L   ALT 21 6 - 29 U/L  B12 and Folate Panel     Status: Abnormal   Collection Time: 11/14/23  8:19 AM  Result Value Ref Range   Vitamin B-12 1,392 (H) 200 - 1,100 pg/mL   Folate 19.7 ng/mL    Comment:                            Reference Range                            Low:           <3.4                            Borderline:    3.4-5.4                            Normal:        >5.4 .   Lipid panel     Status: Abnormal   Collection Time: 11/14/23  8:19 AM  Result Value Ref Range   Cholesterol 249 (H) <200 mg/dL   HDL 53 > OR = 50 mg/dL   Triglycerides 716 (H) <150 mg/dL    Comment: . If a non-fasting specimen was collected, consider repeat triglyceride testing on a fasting specimen if clinically indicated.  Veatrice et al. J. of Clin. Lipidol. 2015;9:129-169. SABRA    LDL Cholesterol (Calc) 150 (H) mg/dL (calc)    Comment: Reference range: <100 . Desirable range <100 mg/dL for primary prevention;   <70 mg/dL for patients with CHD or diabetic patients  with >  or = 2 CHD risk factors. SABRA LDL-C is now calculated using the Martin-Hopkins  calculation, which is a validated novel method providing  better accuracy than the Friedewald equation in the  estimation of LDL-C.  Gladis APPLETHWAITE et al. SANDREA. 7986;689(80): 2061-2068  (http://education.[facility].com/faq/FAQ164)    Total CHOL/HDL Ratio 4.7 <5.0 (calc)   Non-HDL Cholesterol (Calc) 196 (H) <130 mg/dL (calc)    Comment: For patients with diabetes plus 1 major ASCVD risk  factor, treating to a non-HDL-C goal of <100 mg/dL  (LDL-C of <29 mg/dL) is considered a therapeutic  option.   VITAMIN D  25 Hydroxy (Vit-D Deficiency, Fractures)     Status: None   Collection Time: 11/14/23  8:19 AM  Result Value Ref Range   Vit D, 25-Hydroxy 40 30 - 100 ng/mL    Comment: Vitamin D  Status         25-OH Vitamin D : . Deficiency:                    <20 ng/mL Insufficiency:  20 - 29 ng/mL Optimal:                 > or = 30 ng/mL . For 25-OH Vitamin D  testing on patients on  D2-supplementation and patients for whom quantitation  of D2 and D3 fractions is required, the QuestAssureD(TM) 25-OH VIT D, (D2,D3), LC/MS/MS is recommended: order  code 07111 (patients >46yrs). . See Note 1 . Note 1 . For additional information, please refer to  http://education.[facility].com/faq/FAQ199  (This link is being provided for informational/ educational purposes only.)   Hemoglobin A1c     Status: None   Collection Time: 11/14/23  8:19 AM  Result Value Ref Range   Hgb A1c MFr Bld 5.5 <5.7 %    Comment: For the purpose of screening for the presence of diabetes: . <5.7%       Consistent with the absence of diabetes 5.7-6.4%    Consistent with increased risk for diabetes             (prediabetes) > or =6.5%  Consistent with diabetes . This assay result is consistent with a decreased risk of diabetes. . Currently, no consensus exists regarding use of hemoglobin A1c for diagnosis of diabetes in  children. . According to American Diabetes Association (ADA) guidelines, hemoglobin A1c <7.0% represents optimal control in non-pregnant diabetic patients. Different metrics may apply to specific patient populations.  Standards of Medical Care in Diabetes(ADA). .    Mean Plasma Glucose 111 mg/dL   eAG (mmol/L) 6.2 mmol/L    Diabetic Foot Exam:     PHQ2/9:    11/18/2023    8:08 AM 05/19/2023    9:08 AM 09/06/2022   10:04 AM 05/16/2022    9:21 AM 07/26/2021   10:18 AM  Depression screen PHQ 2/9  Decreased Interest 0 0 0 0 0  Down, Depressed, Hopeless 0 0 0 0 0  PHQ - 2 Score 0 0 0 0 0  Altered sleeping 0 1 0 0 0  Tired, decreased energy 0 0 0 0 0  Change in appetite 0 0 0 0 0  Feeling bad or failure about yourself  0 0 0 0 0  Trouble concentrating 0 0 0 0 0  Moving slowly or fidgety/restless 0 0 0 0 0  Suicidal thoughts 0 0 0 0 0  PHQ-9 Score 0 1 0 0 0  Difficult doing work/chores Not difficult at all Not difficult at all       phq 9 is negative  Fall Risk:    11/18/2023    8:08 AM 05/19/2023    9:04 AM 09/06/2022   10:03 AM 05/16/2022    9:21 AM 07/26/2021   10:18 AM  Fall Risk   Falls in the past year? 0 0 0 0 0  Number falls in past yr: 0 0 0 0 0  Injury with Fall? 0 0 0 0 0  Risk for fall due to : No Fall Risks  No Fall Risks No Fall Risks No Fall Risks  Follow up Falls evaluation completed Falls evaluation completed Falls prevention discussed Falls prevention discussed Falls prevention discussed      Data saved with a previous flowsheet row definition      Assessment & Plan Overweight and perimenopausal weight gain Weight gain linked to perimenopause, especially abdominal. Discussed hormonal changes and treatment options. Considered Wellbutrin  and metformin for weight management. Explained metformin's potential side effect of diarrhea and insurance coverage issues for weight loss medications. - Initiate Wellbutrin  in  the morning for weight management and mood  stabilization. - Initiate metformin 500 mg daily, adjust timing if gastrointestinal side effects occur. - Encourage use of MyFitnessPal for dietary tracking and mindfulness. - Advise on portion control and reducing carbohydrate intake.  Perimenopausal symptoms Symptoms include weight gain, emotional changes, and night sweats. Menstrual cycles regular. Discussed hormone replacement therapy benefits for future consideration. - Monitor symptoms and consider hormone replacement therapy if symptoms worsen.  Major depressive disorder, recurrent, in remission In remission, managed with sertraline  50 mg. Discussed dose increase option if symptoms worsen. - Continue sertraline  50 mg daily. - Consider increasing sertraline  dose if depressive symptoms worsen; contact provider if needed.  Anxiety disorder Symptoms managed with current regimen. Wellbutrin  may aid mood stabilization. - Continue current medication regimen. - Initiate Wellbutrin  for additional mood stabilization.  Dyslipidemia Elevated LDL and triglycerides, normal HDL. Low ASCVD risk at 1.3%. Recommended weight loss and dietary changes. - Initiate metformin 500 mg daily to help with triglycerides and insulin resistance. - Encourage dietary modifications to reduce cholesterol and triglycerides. - Monitor lipid levels at follow-up.  General Health Maintenance Colonoscopy due, delayed due to insurance. Mammogram scheduled. Pap smear and tetanus up to date. Discussed shingles vaccination, deferred. Flu shot received. - Schedule colonoscopy. - Ensure mammogram is completed on November 17th. - Consider shingles vaccination at a later date.

## 2023-11-19 ENCOUNTER — Ambulatory Visit

## 2023-11-19 DIAGNOSIS — Z1231 Encounter for screening mammogram for malignant neoplasm of breast: Secondary | ICD-10-CM

## 2023-12-15 ENCOUNTER — Ambulatory Visit
Admission: RE | Admit: 2023-12-15 | Discharge: 2023-12-15 | Disposition: A | Source: Ambulatory Visit | Attending: Family Medicine | Admitting: Family Medicine

## 2023-12-15 DIAGNOSIS — Z1231 Encounter for screening mammogram for malignant neoplasm of breast: Secondary | ICD-10-CM

## 2024-02-17 ENCOUNTER — Ambulatory Visit

## 2024-02-17 DIAGNOSIS — Z860101 Personal history of adenomatous and serrated colon polyps: Secondary | ICD-10-CM | POA: Diagnosis not present

## 2024-02-17 DIAGNOSIS — D122 Benign neoplasm of ascending colon: Secondary | ICD-10-CM | POA: Diagnosis not present

## 2024-02-17 DIAGNOSIS — Z1211 Encounter for screening for malignant neoplasm of colon: Secondary | ICD-10-CM | POA: Diagnosis present

## 2024-05-19 ENCOUNTER — Encounter: Admitting: Family Medicine
